# Patient Record
Sex: Female | Born: 1990 | Race: Black or African American | Hispanic: No | Marital: Single
Health system: Southern US, Community
[De-identification: ages and names within clinical notes are randomized; demographics above are authoritative.]

## PROBLEM LIST (undated history)

## (undated) ENCOUNTER — Inpatient Hospital Stay (HOSPITAL_COMMUNITY): Payer: Self-pay

## (undated) DIAGNOSIS — E669 Obesity, unspecified: Secondary | ICD-10-CM

## (undated) DIAGNOSIS — I10 Essential (primary) hypertension: Secondary | ICD-10-CM

## (undated) DIAGNOSIS — E079 Disorder of thyroid, unspecified: Secondary | ICD-10-CM

## (undated) DIAGNOSIS — J45909 Unspecified asthma, uncomplicated: Secondary | ICD-10-CM

## (undated) DIAGNOSIS — E039 Hypothyroidism, unspecified: Secondary | ICD-10-CM

## (undated) HISTORY — PX: WISDOM TOOTH EXTRACTION: SHX21

---
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
## 2011-09-25 ENCOUNTER — Inpatient Hospital Stay (HOSPITAL_COMMUNITY): Payer: Medicaid Other

## 2011-09-25 ENCOUNTER — Emergency Department (HOSPITAL_COMMUNITY): Payer: Medicaid Other

## 2011-09-25 ENCOUNTER — Encounter (HOSPITAL_COMMUNITY): Payer: Self-pay | Admitting: *Deleted

## 2011-09-25 ENCOUNTER — Inpatient Hospital Stay (HOSPITAL_COMMUNITY)
Admission: EM | Admit: 2011-09-25 | Discharge: 2011-09-26 | Disposition: A | Discharge status: Hospice | Payer: Medicaid Other | Attending: Obstetrics and Gynecology | Admitting: Obstetrics and Gynecology

## 2011-09-25 DIAGNOSIS — E039 Hypothyroidism, unspecified: Secondary | ICD-10-CM | POA: Insufficient documentation

## 2011-09-25 DIAGNOSIS — O99891 Other specified diseases and conditions complicating pregnancy: Secondary | ICD-10-CM | POA: Insufficient documentation

## 2011-09-25 DIAGNOSIS — J111 Influenza due to unidentified influenza virus with other respiratory manifestations: Secondary | ICD-10-CM | POA: Insufficient documentation

## 2011-09-25 DIAGNOSIS — Z331 Pregnant state, incidental: Secondary | ICD-10-CM

## 2011-09-25 DIAGNOSIS — E119 Type 2 diabetes mellitus without complications: Secondary | ICD-10-CM | POA: Insufficient documentation

## 2011-09-25 DIAGNOSIS — O10019 Pre-existing essential hypertension complicating pregnancy, unspecified trimester: Secondary | ICD-10-CM | POA: Insufficient documentation

## 2011-09-25 DIAGNOSIS — E079 Disorder of thyroid, unspecified: Secondary | ICD-10-CM | POA: Insufficient documentation

## 2011-09-25 DIAGNOSIS — O0930 Supervision of pregnancy with insufficient antenatal care, unspecified trimester: Secondary | ICD-10-CM | POA: Insufficient documentation

## 2011-09-25 DIAGNOSIS — O98519 Other viral diseases complicating pregnancy, unspecified trimester: Secondary | ICD-10-CM

## 2011-09-25 DIAGNOSIS — R109 Unspecified abdominal pain: Secondary | ICD-10-CM

## 2011-09-25 DIAGNOSIS — O24919 Unspecified diabetes mellitus in pregnancy, unspecified trimester: Secondary | ICD-10-CM | POA: Insufficient documentation

## 2011-09-25 DIAGNOSIS — O99280 Endocrine, nutritional and metabolic diseases complicating pregnancy, unspecified trimester: Secondary | ICD-10-CM | POA: Insufficient documentation

## 2011-09-25 HISTORY — DX: Essential (primary) hypertension: I10

## 2011-09-25 HISTORY — DX: Disorder of thyroid, unspecified: E07.9

## 2011-09-25 HISTORY — DX: Hypothyroidism, unspecified: E03.9

## 2011-09-25 LAB — BASIC METABOLIC PANEL
BUN: 5 mg/dL — ABNORMAL LOW (ref 6–23)
Calcium: 9.5 mg/dL (ref 8.4–10.5)
Potassium: 3.2 mEq/L — ABNORMAL LOW (ref 3.5–5.1)
Sodium: 132 mEq/L — ABNORMAL LOW (ref 135–145)

## 2011-09-25 LAB — CBC
HCT: 35.1 % — ABNORMAL LOW (ref 36.0–46.0)
Hemoglobin: 11.8 g/dL — ABNORMAL LOW (ref 12.0–15.0)
MCH: 23.6 pg — ABNORMAL LOW (ref 26.0–34.0)
MCHC: 33.2 g/dL (ref 30.0–36.0)
MCV: 70.1 fL — ABNORMAL LOW (ref 78.0–100.0)
MCV: 70.7 fL — ABNORMAL LOW (ref 78.0–100.0)
Platelets: 103 10*3/uL — ABNORMAL LOW (ref 150–400)
Platelets: 106 10*3/uL — ABNORMAL LOW (ref 150–400)
RDW: 16.2 % — ABNORMAL HIGH (ref 11.5–15.5)

## 2011-09-25 LAB — COMPREHENSIVE METABOLIC PANEL
ALT: 26 U/L (ref 0–35)
AST: 36 U/L (ref 0–37)
Albumin: 3.4 g/dL — ABNORMAL LOW (ref 3.5–5.2)
BUN: 5 mg/dL — ABNORMAL LOW (ref 6–23)
Creatinine, Ser: 0.57 mg/dL (ref 0.50–1.10)
GFR calc non Af Amer: >90 mL/min (ref >90)
Potassium: 3.4 mEq/L — ABNORMAL LOW (ref 3.5–5.1)
Sodium: 135 mEq/L (ref 135–145)
Total Protein: 7.6 g/dL (ref 6.0–8.3)

## 2011-09-25 LAB — URINE MICROSCOPIC-ADD ON

## 2011-09-25 LAB — URINALYSIS, ROUTINE W REFLEX MICROSCOPIC
Ketones, ur: >80 mg/dL — AB
Leukocytes, UA: NEGATIVE
Nitrite: NEGATIVE
Protein, ur: 100 mg/dL — AB
Specific Gravity, Urine: 1.024 (ref 1.005–1.030)
pH: 6 (ref 5.0–8.0)

## 2011-09-25 LAB — DIFFERENTIAL
Basophils Absolute: 0 10*3/uL (ref 0.0–0.1)
Eosinophils Absolute: 0.1 10*3/uL (ref 0.0–0.7)
Eosinophils Relative: 1 % (ref 0–5)
Neutro Abs: 4.3 10*3/uL (ref 1.7–7.7)

## 2011-09-25 LAB — HCG, QUANTITATIVE, PREGNANCY: hCG, Beta Chain, Quant, S: 20287 m[IU]/mL — ABNORMAL HIGH (ref <5)

## 2011-09-25 MED ORDER — SODIUM CHLORIDE 0.9 % IV BOLUS (SEPSIS): 1000.0000 mL | Freq: Once | INTRAVENOUS | Status: DC

## 2011-09-25 MED ORDER — SODIUM CHLORIDE 0.9 % IV BOLUS (SEPSIS)
1000.0000 mL | Freq: Once | INTRAVENOUS | Status: AC
  Administered 2011-09-25: 1000 mL via INTRAVENOUS

## 2011-09-25 NOTE — ED Provider Notes (Signed)
Patient reevaluated from triage. Present with 3 days of body aches, runny nose, subjective fever and chills with abdominal cramping and nausea. On exam she is obese but has a palpable uterus midway between the xiphoid and umbilicus. She denies any vaginal bleeding or discharge. She did not know she was pregnant. She does not know when her last period was in she is on Depo-Provera. Bimanual exam performed by the nurse shows a high and soft cervix. OB rapid response nurse consult and patient placed on monitor.  BP 138/81  Temp(Src) 98.2 F (36.8 C) (Oral)  Resp 22  SpO2 98%  Patient accepted in transfer to [facility] by Dr. Claiborne Billings.  Glynn Octave, MD 09/25/11 2119

## 2011-09-25 NOTE — MAU Provider Note (Signed)
History     CSN: [id number]  Arrival date and time: 09/25/11 1642   None     Chief Complaint  Patient presents with  . Influenza   HPI 21 y.o. G1P0 at Unknown EGA. Sent from [facility] ED for eval d/t no prenatal care. Pt went to ED tonight for sore throat/sinus congestion, + UPT there. Pt unaware of pregnancy. LMP was sometime in November. Had Depo Provera for the first time 6 months ago, missed second injection. Currently being followed by PCP for HTN, hypthyroid and Type II diabetes. She has been on lisinopril throughout this pregnancy. She also takes metformin and synthroid.    Past Medical History  Diagnosis Date  . Thyroid disease   . Hypertension   . Diabetes mellitus   . Hypothyroidism     Past Surgical History  Procedure Date  . Wisdom tooth extraction     Family History  Problem Relation Age of Onset  . Cancer Paternal Aunt   . Cancer Paternal Uncle   . Diabetes Paternal Grandmother   . Diabetes Paternal Grandfather     History  Substance Use Topics  . Smoking status: Never Smoker   . Smokeless tobacco: Not on file  . Alcohol Use: No    Allergies: No Known Allergies   (Not in a hospital admission)  Review of Systems  Constitutional: Negative.   HENT: Positive for congestion and sore throat.   Respiratory: Negative.   Cardiovascular: Negative.   Gastrointestinal: Negative for nausea, vomiting, abdominal pain, diarrhea and constipation.  Genitourinary: Negative for dysuria, urgency, frequency, hematuria and flank pain.       Negative for vaginal bleeding, vaginal discharge, cramping/contractions  Musculoskeletal: Negative.   Neurological: Negative.   Psychiatric/Behavioral: Negative.    Physical Exam   Blood pressure 135/61, pulse 97, temperature 98.2 F (36.8 C), temperature source Oral, resp. rate 22, SpO2 100.00%.  Physical Exam  Nursing note and vitals reviewed. Constitutional: She is oriented to person, place, and time. She appears  well-developed and well-nourished. No distress.       Obese   Cardiovascular: Normal rate.   Respiratory: Effort normal. No respiratory distress.  GI: Soft.  Musculoskeletal: Normal range of motion.  Neurological: She is alert and oriented to person, place, and time.  Skin: Skin is warm and dry.  Psychiatric: She has a normal mood and affect.    MAU Course  Procedures  Results for orders placed during the hospital encounter of 09/25/11 (from the past 24 hour(s))  CBC     Status: Abnormal   Collection Time   09/25/11  5:37 PM      Component Value Range   WBC 6.8  4.0 - 10.5 (K/uL)   RBC 5.01  3.87 - 5.11 (MIL/uL)   Hemoglobin 11.8 (*) 12.0 - 15.0 (g/dL)   HCT 96.2 (*) 95.2 - 46.0 (%)   MCV 70.1 (*) 78.0 - 100.0 (fL)   MCH 23.6 (*) 26.0 - 34.0 (pg)   MCHC 33.6  30.0 - 36.0 (g/dL)   RDW 84.1 (*) 32.4 - 15.5 (%)   Platelets 103 (*) 150 - 400 (K/uL)  BASIC METABOLIC PANEL     Status: Abnormal   Collection Time   09/25/11  5:37 PM      Component Value Range   Sodium 132 (*) 135 - 145 (mEq/L)   Potassium 3.2 (*) 3.5 - 5.1 (mEq/L)   Chloride 100  96 - 112 (mEq/L)   CO2 17 (*) 19 - 32 (mEq/L)  Glucose, Bld 124 (*) 70 - 99 (mg/dL)   BUN 5 (*) 6 - 23 (mg/dL)   Creatinine, Ser 1.61 (*) 0.50 - 1.10 (mg/dL)   Calcium 9.5  8.4 - 09.6 (mg/dL)   GFR calc non Af Amer >90  >90 (mL/min)   GFR calc Af Amer >90  >90 (mL/min)  URINALYSIS, ROUTINE W REFLEX MICROSCOPIC     Status: Abnormal   Collection Time   09/25/11  5:42 PM      Component Value Range   Color, Urine YELLOW  YELLOW    APPearance CLOUDY (*) CLEAR    Specific Gravity, Urine 1.024  1.005 - 1.030    pH 6.0  5.0 - 8.0    Glucose, UA 100 (*) NEGATIVE (mg/dL)   Hgb urine dipstick TRACE (*) NEGATIVE    Bilirubin Urine SMALL (*) NEGATIVE    Ketones, ur >80 (*) NEGATIVE (mg/dL)   Protein, ur 045 (*) NEGATIVE (mg/dL)   Urobilinogen, UA 1.0  0.0 - 1.0 (mg/dL)   Nitrite NEGATIVE  NEGATIVE    Leukocytes, UA NEGATIVE  NEGATIVE   POCT  PREGNANCY, URINE     Status: Abnormal   Collection Time   09/25/11  5:42 PM      Component Value Range   Preg Test, Ur POSITIVE (*) NEGATIVE   URINE MICROSCOPIC-ADD ON     Status: Abnormal   Collection Time   09/25/11  5:42 PM      Component Value Range   Squamous Epithelial / LPF FEW (*) RARE    WBC, UA 0-2  <3 (WBC/hpf)   RBC / HPF 0-2  <3 (RBC/hpf)   Bacteria, UA RARE  RARE    Casts GRANULAR CAST (*) NEGATIVE    Crystals CA OXALATE CRYSTALS (*) NEGATIVE    Urine-Other AMORPHOUS URATES/PHOSPHATES    CBC     Status: Abnormal   Collection Time   09/25/11  7:03 PM      Component Value Range   WBC 7.6  4.0 - 10.5 (K/uL)   RBC 5.02  3.87 - 5.11 (MIL/uL)   Hemoglobin 11.8 (*) 12.0 - 15.0 (g/dL)   HCT 40.9 (*) 81.1 - 46.0 (%)   MCV 70.7 (*) 78.0 - 100.0 (fL)   MCH 23.5 (*) 26.0 - 34.0 (pg)   MCHC 33.2  30.0 - 36.0 (g/dL)   RDW 91.4 (*) 78.2 - 15.5 (%)   Platelets 106 (*) 150 - 400 (K/uL)  DIFFERENTIAL     Status: Normal   Collection Time   09/25/11  7:03 PM      Component Value Range   Neutrophils Relative 57  43 - 77 (%)   Neutro Abs 4.3  1.7 - 7.7 (K/uL)   Lymphocytes Relative 36  12 - 46 (%)   Lymphs Abs 2.8  0.7 - 4.0 (K/uL)   Monocytes Relative 6  3 - 12 (%)   Monocytes Absolute 0.5  0.1 - 1.0 (K/uL)   Eosinophils Relative 1  0 - 5 (%)   Eosinophils Absolute 0.1  0.0 - 0.7 (K/uL)   Basophils Relative 0  0 - 1 (%)   Basophils Absolute 0.0  0.0 - 0.1 (K/uL)  COMPREHENSIVE METABOLIC PANEL     Status: Abnormal   Collection Time   09/25/11  7:03 PM      Component Value Range   Sodium 135  135 - 145 (mEq/L)   Potassium 3.4 (*) 3.5 - 5.1 (mEq/L)   Chloride 101  96 - 112 (  mEq/L)   CO2 21  19 - 32 (mEq/L)   Glucose, Bld 117 (*) 70 - 99 (mg/dL)   BUN 5 (*) 6 - 23 (mg/dL)   Creatinine, Ser 1.61  0.50 - 1.10 (mg/dL)   Calcium 9.7  8.4 - 09.6 (mg/dL)   Total Protein 7.6  6.0 - 8.3 (g/dL)   Albumin 3.4 (*) 3.5 - 5.2 (g/dL)   AST 36  0 - 37 (U/L)   ALT 26  0 - 35 (U/L)   Alkaline  Phosphatase 115  39 - 117 (U/L)   Total Bilirubin 0.2 (*) 0.3 - 1.2 (mg/dL)   GFR calc non Af Amer >90  >90 (mL/min)   GFR calc Af Amer >90  >90 (mL/min)  HCG, QUANTITATIVE, PREGNANCY     Status: Abnormal   Collection Time   09/25/11  7:03 PM      Component Value Range   hCG, Beta Chain, Quant, S 20287 (*) <5 (mIU/mL)     Assessment and Plan  U/S report pending Care assumed by S. [place], CNM  [facility] 09/25/2011, 10:19 PM

## 2011-09-25 NOTE — ED Notes (Signed)
OB RR RN Victorino Dike here and with patient.

## 2011-09-25 NOTE — Discharge Instructions (Signed)
ABCs of Pregnancy A Antepartum care is very important. Be sure you see your doctor and get prenatal care as soon as you think you are pregnant. At this time, you will be tested for infection, genetic abnormalities and potential problems with you and the pregnancy. This is the time to discuss diet, exercise, work, medications, labor, pain medication during labor and the possibility of a cesarean delivery. Ask any questions that may concern you. It is important to see your doctor regularly throughout your pregnancy. Avoid exposure to toxic substances and chemicals - such as cleaning solvents, lead and mercury, some insecticides, and paint. Pregnant women should avoid exposure to paint fumes, and fumes that cause you to feel ill, dizzy or faint. When possible, it is a good idea to have a pre-pregnancy consultation with your caregiver to begin some important recommendations your caregiver suggests such as, taking folic acid, exercising, quitting smoking, avoiding alcoholic beverages, etc. B Breastfeeding is the healthiest choice for both you and your baby. It has many nutritional benefits for the baby and health benefits for the mother. It also creates a very tight and loving bond between the baby and mother. Talk to your doctor, your family and friends, and your employer about how you choose to feed your baby and how they can support you in your decision. Not all birth defects can be prevented, but a woman can take actions that may increase her chance of having a healthy baby. Many birth defects happen very early in pregnancy, sometimes before a woman even knows she is pregnant. Birth defects or abnormalities of any child in your or the father's family should be discussed with your caregiver. Get a good support bra as your breast size changes. Wear it especially when you exercise and when nursing.  C Celebrate the news of your pregnancy with the your spouse/father and family. Childbirth classes are helpful to  take for you and the spouse/father because it helps to understand what happens during the pregnancy, labor and delivery. Cesarean delivery should be discussed with your doctor so you are prepared for that possibility. The pros and cons of circumcision if it is a boy, should be discussed with your pediatrician. Cigarette smoking during pregnancy can result in low birth weight babies. It has been associated with infertility, miscarriages, tubal pregnancies, infant death (mortality) and poor health (morbidity) in childhood. Additionally, cigarette smoking may cause long-term learning disabilities. If you smoke, you should try to quit before getting pregnant and not smoke during the pregnancy. Secondary smoke may also harm a mother and her developing baby. It is a good idea to ask people to stop smoking around you during your pregnancy and after the baby is born. Extra calcium is necessary when you are pregnant and is found in your prenatal vitamin, in dairy products, green leafy vegetables and in calcium supplements. D A healthy diet according to your current weight and height, along with vitamins and mineral supplements should be discussed with your caregiver. Domestic abuse or violence should be made known to your doctor right away to get the situation corrected. Drink more water when you exercise to keep hydrated. Discomfort of your back and legs usually develops and progresses from the middle of the second trimester through to delivery of the baby. This is because of the enlarging baby and uterus, which may also affect your balance. Do not take illegal drugs. Illegal drugs can seriously harm the baby and you. Drink extra fluids (water is best) throughout pregnancy to help  your body keep up with the increases in your blood volume. Drink at least 6 to 8 glasses of water, fruit juice, or milk each day. A good way to know you are drinking enough fluid is when your urine looks almost like clear water or is very light  yellow.  E Eat healthy to get the nutrients you and your unborn baby need. Your meals should include the five basic food groups. Exercise (30 minutes of light to moderate exercise a day) is important and encouraged during pregnancy, if there are no medical problems or problems with the pregnancy. Exercise that causes discomfort or dizziness should be stopped and reported to your caregiver. Emotions during pregnancy can change from being ecstatic to depression and should be understood by you, your partner and your family. F Fetal screening with ultrasound, amniocentesis and monitoring during pregnancy and labor is common and sometimes necessary. Take 400 micrograms of folic acid daily both before, when possible, and during the first few months of pregnancy to reduce the risk of birth defects of the brain and spine. All women who could possibly become pregnant should take a vitamin with folic acid, every day. It is also important to eat a healthy diet with fortified foods (enriched grain products, including cereals, rice, breads, and pastas) and foods with natural sources of folate (orange juice, green leafy vegetables, beans, peanuts, broccoli, asparagus, peas, and lentils). The father should be involved with all aspects of the pregnancy including, the prenatal care, childbirth classes, labor, delivery, and postpartum time. Fathers may also have emotional concerns about being a father, financial needs, and raising a family. G Genetic testing should be done appropriately. It is important to know your family and the father's history. If there have been problems with pregnancies or birth defects in your family, report these to your doctor. Also, genetic counselors can talk with you about the information you might need in making decisions about having a family. You can call a major medical center in your area for help in finding a board-certified genetic counselor. Genetic testing and counseling should be done  before pregnancy when possible, especially if there is a history of problems in the mother's or father's family. Certain ethnic backgrounds are more at risk for genetic defects. H Get familiar with the hospital where you will be having your baby. Get to know how long it takes to get there, the labor and delivery area, and the hospital procedures. Be sure your medical insurance is accepted there. Get your home ready for the baby including, clothes, the baby's room (when possible), furniture and car seat. Hand washing is important throughout the day, especially after handling raw meat and poultry, changing the baby's diaper or using the bathroom. This can help prevent the spread of many bacteria and viruses that cause infection. Your hair may become dry and thinner, but will return to normal a few weeks after the baby is born. Heartburn is a common problem that can be treated by taking antacids recommended by your caregiver, eating smaller meals 5 or 6 times a day, not drinking liquids when eating, drinking between meals and raising the head of your bed 2 to 3 inches. I Insurance to cover you, the baby, doctor and hospital should be reviewed so that you will be prepared to pay any costs not covered by your insurance plan. If you do not have medical insurance, there are usually clinics and services available for you in your community. Take 30 milligrams of iron during  your pregnancy as prescribed by your doctor to reduce the risk of low red blood cells (anemia) later in pregnancy. All women of childbearing age should eat a diet rich in iron. J There should be a joint effort for the mother, father and any other children to adapt to the pregnancy financially, emotionally, and psychologically during the pregnancy. Join a support group for moms-to-be. Or, join a class on parenting or childbirth. Have the family participate when possible. K Know your limits. Let your caregiver know if you experience any of the  following:   Pain of any kind.   Strong cramps.   You develop a lot of weight in a short period of time (5 pounds in 3 to 5 days).   Vaginal bleeding, leaking of amniotic fluid.   Headache, vision problems.   Dizziness, fainting, shortness of breath.   Chest pain.   Fever of 102 F (38.9 C) or higher.   Gush of clear fluid from your vagina.   Painful urination.   Domestic violence.   Irregular heartbeat (palpitations).   Rapid beating of the heart (tachycardia).   Constant feeling sick to your stomach (nauseous) and vomiting.   Trouble walking, fluid retention (edema).   Muscle weakness.   If your baby has decreased activity.   Persistent diarrhea.   Abnormal vaginal discharge.   Uterine contractions at 20-minute intervals.   Back pain that travels down your leg.  L Learn and practice that what you eat and drink should be in moderation and healthy for you and your baby. Legal drugs such as alcohol and caffeine are important issues for pregnant women. There is no safe amount of alcohol a woman can drink while pregnant. Fetal alcohol syndrome, a disorder characterized by growth retardation, facial abnormalities, and central nervous system dysfunction, is caused by a woman's use of alcohol during pregnancy. Caffeine, found in tea, coffee, soft drinks and chocolate, should also be limited. Be sure to read labels when trying to cut down on caffeine during pregnancy. More than 200 foods, beverages, and over-the-counter medications contain caffeine and have a high salt content! There are coffees and teas that do not contain caffeine. M Medical conditions such as diabetes, epilepsy, and high blood pressure should be treated and kept under control before pregnancy when possible, but especially during pregnancy. Ask your caregiver about any medications that may need to be changed or adjusted during pregnancy. If you are currently taking any medications, ask your caregiver if it  is safe to take them while you are pregnant or before getting pregnant when possible. Also, be sure to discuss any herbs or vitamins you are taking. They are medicines, too! Discuss with your doctor all medications, prescribed and over-the-counter, that you are taking. During your prenatal visit, discuss the medications your doctor may give you during labor and delivery. N Never be afraid to ask your doctor or caregiver questions about your health, the progress of the pregnancy, family problems, stressful situations, and recommendation for a pediatrician, if you do not have one. It is better to take all precautions and discuss any questions or concerns you may have during your office visits. It is a good idea to write down your questions before you visit the doctor. O Over-the-counter cough and cold remedies may contain alcohol or other ingredients that should be avoided during pregnancy. Ask your caregiver about prescription, herbs or over-the-counter medications that you are taking or may consider taking while pregnant.  P Physical activity during pregnancy can  benefit both you and your baby by lessening discomfort and fatigue, providing a sense of well-being, and increasing the likelihood of early recovery after delivery. Light to moderate exercise during pregnancy strengthens the belly (abdominal) and back muscles. This helps improve posture. Practicing yoga, walking, swimming, and cycling on a stationary bicycle are usually safe exercises for pregnant women. Avoid scuba diving, exercise at high altitudes (over 3000 feet), skiing, horseback riding, contact sports, etc. Always check with your doctor before beginning any kind of exercise, especially during pregnancy and especially if you did not exercise before getting pregnant. Q Queasiness, stomach upset and morning sickness are common during pregnancy. Eating a couple of crackers or dry toast before getting out of bed. Foods that you normally love may  make you feel sick to your stomach. You may need to substitute other nutritious foods. Eating 5 or 6 small meals a day instead of 3 large ones may make you feel better. Do not drink with your meals, drink between meals. Questions that you have should be written down and asked during your prenatal visits. R Read about and make plans to baby-proof your home. There are important tips for making your home a safer environment for your baby. Review the tips and make your home safer for you and your baby. Read food labels regarding calories, salt and fat content in the food. S Saunas, hot tubs, and steam rooms should be avoided while you are pregnant. Excessive high heat may be harmful during your pregnancy. Your caregiver will screen and examine you for sexually transmitted diseases and genetic disorders during your prenatal visits. Learn the signs of labor. Sexual relations while pregnant is safe unless there is a medical or pregnancy problem and your caregiver advises against it. T Traveling long distances should be avoided especially in the third trimester of your pregnancy. If you do have to travel out of state, be sure to take a copy of your medical records and medical insurance plan with you. You should not travel long distances without seeing your doctor first. Most airlines will not allow you to travel after 36 weeks of pregnancy. Toxoplasmosis is an infection caused by a parasite that can seriously harm an unborn baby. Avoid eating undercooked meat and handling cat litter. Be sure to wear gloves when gardening. Tingling of the hands and fingers is not unusual and is due to fluid retention. This will go away after the baby is born. U Womb (uterus) size increases during the first trimester. Your kidneys will begin to function more efficiently. This may cause you to feel the need to urinate more often. You may also leak urine when sneezing, coughing or laughing. This is due to the growing uterus pressing  against your bladder, which lies directly in front of and slightly under the uterus during the first few months of pregnancy. If you experience burning along with frequency of urination or bloody urine, be sure to tell your doctor. The size of your uterus in the third trimester may cause a problem with your balance. It is advisable to maintain good posture and avoid wearing high heels during this time. An ultrasound of your baby may be necessary during your pregnancy and is safe for you and your baby. V Vaccinations are an important concern for pregnant women. Get needed vaccines before pregnancy. Center for Disease Control (http://www.wolf.info/) has clear guidelines for the use of vaccines during pregnancy. Review the list, be sure to discuss it with your doctor. Prenatal vitamins are helpful  and healthy for you and the baby. Do not take extra vitamins except what is recommended. Taking too much of certain vitamins can cause overdose problems. Continuous vomiting should be reported to your caregiver. Varicose veins may appear especially if there is a family history of varicose veins. They should subside after the delivery of the baby. Support hose helps if there is leg discomfort. W Being overweight or underweight during pregnancy may cause problems. Try to get within 15 pounds of your ideal weight before pregnancy. Remember, pregnancy is not a time to be dieting! Do not stop eating or start skipping meals as your weight increases. Both you and your baby need the calories and nutrition you receive from a healthy diet. Be sure to consult with your doctor about your diet. There is a formula and diet plan available depending on whether you are overweight or underweight. Your caregiver or nutritionist can help and advise you if necessary. X Avoid X-rays. If you must have dental work or diagnostic tests, tell your dentist or physician that you are pregnant so that extra care can be taken. X-rays should only be taken when  the risks of not taking them outweigh the risk of taking them. If needed, only the minimum amount of radiation should be used. When X-rays are necessary, protective lead shields should be used to cover areas of the body that are not being X-rayed. Y Your baby loves you. Breastfeeding your baby creates a loving and very close bond between the two of you. Give your baby a healthy environment to live in while you are pregnant. Infants and children require constant care and guidance. Their health and safety should be carefully watched at all times. After the baby is born, rest or take a nap when the baby is sleeping. Z Get your ZZZs. Be sure to get plenty of rest. Resting on your side as often as possible, especially on your left side is advised. It provides the best circulation to your baby and helps reduce swelling. Try taking a nap for 30 to 45 minutes in the afternoon when possible. After the baby is born rest or take a nap when the baby is sleeping. Try elevating your feet for that amount of time when possible. It helps the circulation in your legs and helps reduce swelling.  Most information courtesy of the CDC. Document Released: 05/12/2005 Document Revised: 05/01/2011 Document Reviewed: 01/24/2009 [facility] Patient Information 2012 [place], [place].Abdominal Pain During Pregnancy Belly (abdominal) pain is common during pregnancy. Most of the time, it is not a serious problem. Other times, it can be a sign that something is wrong with the pregnancy. Always tell your doctor if you have belly pain. HOME CARE For mild pain:  Do not have sex (intercourse) or put anything in your vagina until you feel better.   Rest until your pain stops. If your pain lasts longer than 1 hour, call your doctor.   Drink clear fluids if you feel sick to your stomach (nauseous).   Do not eat solid food until you feel better.   Only take medicine as told by your doctor.   Keep all doctor visits as told.  GET HELP  RIGHT AWAY IF:   You are bleeding, leaking fluid, or pieces of tissue come out of your vagina.   You have more pain or cramping.   You keep throwing up (vomiting).   You have pain when you pee (urinate) or have blood in your pee.   You have a  fever.   You do not feel your baby moving as much.   You feel very weak or feel like passing out.   You have trouble breathing, with or without belly pain.   You have a very bad headache and belly pain.   You have fluid leaking from your vagina and belly pain.   You keep having watery poop (diarrhea).   Your belly pain does not go away after resting, or the pain gets worse.  MAKE SURE YOU:   Understand these instructions.   Will watch your condition.   Will get help right away if you are not doing well or get worse.  Document Released: 04/30/2009 Document Revised: 05/01/2011 Document Reviewed: 12/06/2010 [facility] Patient Information 2012 [place], [place].Upper Respiratory Infection, Adult An upper respiratory infection (URI) is also sometimes known as the common cold. The upper respiratory tract includes the nose, sinuses, throat, trachea, and bronchi. Bronchi are the airways leading to the lungs. Most people improve within 1 week, but symptoms can last up to 2 weeks. A residual cough may last even longer.  CAUSES Many different viruses can infect the tissues lining the upper respiratory tract. The tissues become irritated and inflamed and often become very moist. Mucus production is also common. A cold is contagious. You can easily spread the virus to others by oral contact. This includes kissing, sharing a glass, coughing, or sneezing. Touching your mouth or nose and then touching a surface, which is then touched by another person, can also spread the virus. SYMPTOMS  Symptoms typically develop 1 to 3 days after you come in contact with a cold virus. Symptoms vary from person to person. They may include:  Runny nose.   Sneezing.    Nasal congestion.   Sinus irritation.   Sore throat.   Loss of voice (laryngitis).   Cough.   Fatigue.   Muscle aches.   Loss of appetite.   Headache.   Low-grade fever.  DIAGNOSIS  You might diagnose your own cold based on familiar symptoms, since most people get a cold 2 to 3 times a year. Your caregiver can confirm this based on your exam. Most importantly, your caregiver can check that your symptoms are not due to another disease such as strep throat, sinusitis, pneumonia, asthma, or epiglottitis. Blood tests, throat tests, and X-rays are not necessary to diagnose a common cold, but they may sometimes be helpful in excluding other more serious diseases. Your caregiver will decide if any further tests are required. RISKS AND COMPLICATIONS  You may be at risk for a more severe case of the common cold if you smoke cigarettes, have chronic heart disease (such as heart failure) or lung disease (such as asthma), or if you have a weakened immune system. The very young and very old are also at risk for more serious infections. Bacterial sinusitis, middle ear infections, and bacterial pneumonia can complicate the common cold. The common cold can worsen asthma and chronic obstructive pulmonary disease (COPD). Sometimes, these complications can require emergency medical care and may be life-threatening. PREVENTION  The best way to protect against getting a cold is to practice good hygiene. Avoid oral or hand contact with people with cold symptoms. Wash your hands often if contact occurs. There is no clear evidence that vitamin C, vitamin E, echinacea, or exercise reduces the chance of developing a cold. However, it is always recommended to get plenty of rest and practice good nutrition. TREATMENT  Treatment is directed at relieving symptoms. There  is no cure. Antibiotics are not effective, because the infection is caused by a virus, not by bacteria. Treatment may include:  Increased fluid  intake. Sports drinks offer valuable electrolytes, sugars, and fluids.   Breathing heated mist or steam (vaporizer or shower).   Eating chicken soup or other clear broths, and maintaining good nutrition.   Getting plenty of rest.   Using gargles or lozenges for comfort.   Controlling fevers with ibuprofen or acetaminophen as directed by your caregiver.   Increasing usage of your inhaler if you have asthma.  Zinc gel and zinc lozenges, taken in the first 24 hours of the common cold, can shorten the duration and lessen the severity of symptoms. Pain medicines may help with fever, muscle aches, and throat pain. A variety of non-prescription medicines are available to treat congestion and runny nose. Your caregiver can make recommendations and may suggest nasal or lung inhalers for other symptoms.  HOME CARE INSTRUCTIONS   Only take over-the-counter or prescription medicines for pain, discomfort, or fever as directed by your caregiver.   Use a warm mist humidifier or inhale steam from a shower to increase air moisture. This may keep secretions moist and make it easier to breathe.   Drink enough water and fluids to keep your urine clear or pale yellow.   Rest as needed.   Return to work when your temperature has returned to normal or as your caregiver advises. You may need to stay home longer to avoid infecting others. You can also use a face mask and careful hand washing to prevent spread of the virus.  SEEK MEDICAL CARE IF:   After the first few days, you feel you are getting worse rather than better.   You need your caregiver's advice about medicines to control symptoms.   You develop chills, worsening shortness of breath, or brown or red sputum. These may be signs of pneumonia.   You develop yellow or brown nasal discharge or pain in the face, especially when you bend forward. These may be signs of sinusitis.   You develop a fever, swollen neck glands, pain with swallowing, or white  areas in the back of your throat. These may be signs of strep throat.  SEEK IMMEDIATE MEDICAL CARE IF:   You have a fever.   You develop severe or persistent headache, ear pain, sinus pain, or chest pain.   You develop wheezing, a prolonged cough, cough up blood, or have a change in your usual mucus (if you have chronic lung disease).   You develop sore muscles or a stiff neck.  Document Released: 11/05/2000 Document Revised: 05/01/2011 Document Reviewed: 09/13/2010 [facility] Patient Information 2012 [place], [place].

## 2011-09-25 NOTE — Progress Notes (Signed)
Called to assess pt with no PNC, pt alert in NAD. Unsure of LMP, est 30weeks by fundal height. Call to unassigned Dr Claiborne Billings, accepting pt for transport to [facility] for work up. EDP ordered transfer and carelink notified, MAU charge RN Debra notified, care link here, pt verbalizes uinderstanding. To [facility]U

## 2011-09-25 NOTE — ED Notes (Signed)
Pt is here for URI and body aches and fever and chills since Monday.  Pt states that she has been having nausea and vomiting with this. Pt also has abdominal pain with this

## 2011-09-25 NOTE — ED Notes (Signed)
OB RR RN notified to come to ED.  Patient being placed on fetal monitor.

## 2011-09-25 NOTE — MAU Note (Signed)
Pt was  Sent here from [facility] hosp for further eval.  C/o sore throat, dry cough, running nose and headache X 3 days.

## 2011-09-25 NOTE — ED Provider Notes (Addendum)
History  Scribed for Hilario Quarry, MD, the patient was seen in room STRE8/STRE8. This chart was scribed by Candelaria Stagers. The patient's care started at 6:06 PM    CSN: [id number]  Arrival date & time 09/25/11  1642   None     Chief Complaint  Patient presents with  . Influenza    HPI Sandra House is a 21 y.o. female who presents to the Emergency Department complaining of body aches that started with a cough on Monday getting worse today.  She is experiencing sore throat, vomiting, and cough.  She denies fever.  She has been able to keep fluids done today. She has a h/o diabetes.    Past Medical History  Diagnosis Date  . Thyroid disease   . Hypertension   . Diabetes mellitus     History reviewed. No pertinent past surgical history.  No family history on file.  History  Substance Use Topics  . Smoking status: Not on file  . Smokeless tobacco: Not on file  . Alcohol Use: Yes    OB History    Grav Para Term Preterm Abortions TAB SAB Ect Mult Living                  Review of Systems  HENT: Positive for sore throat and rhinorrhea.   Respiratory: Positive for cough.   Gastrointestinal: Positive for nausea and vomiting.  All other systems reviewed and are negative.    Allergies  Review of patient's allergies indicates no known allergies.  Home Medications   Current Outpatient Rx  Name Route Sig Dispense Refill  . LEVOTHYROXINE SODIUM 50 MCG PO TABS Oral Take 50 mcg by mouth daily.    Marland Kitchen LISINOPRIL 20 MG PO TABS Oral Take 20 mg by mouth daily.    Marland Kitchen METFORMIN HCL 500 MG PO TABS Oral Take 500 mg by mouth 2 (two) times daily with a meal.      BP 138/81  Temp(Src) 98.2 F (36.8 C) (Oral)  Resp 22  SpO2 98%  Physical Exam  Nursing note and vitals reviewed. Constitutional: She is oriented to person, place, and time. She appears well-developed and well-nourished.  HENT:  Head: Normocephalic and atraumatic.  Eyes: EOM are normal.  Neck: Normal range  of motion. Neck supple.  Pulmonary/Chest: Effort normal and breath sounds normal. She has no wheezes. She has no rales.  Abdominal: There is no tenderness.       Patient is morbidly obese and has large firm abdomen.   Musculoskeletal: Normal range of motion. She exhibits no edema.  Neurological: She is alert and oriented to person, place, and time.  Skin: Skin is warm and dry.  Psychiatric: She has a normal mood and affect. Her behavior is normal.    ED Course  Procedures  DIAGNOSTIC STUDIES: Oxygen Saturation is 98% on room air, normal by my interpretation.    COORDINATION OF CARE:  6:33PM Ordered: Sodium chloride 0.9% bolus 1,062mL: CBC: Differential: Comprehensive metabolic panel: hCG, quantitate, pregnancy, US Ob Comp+ 14 Wk;    Labs Reviewed  POCT PREGNANCY, URINE - Abnormal; Notable for the following:    Preg Test, Ur POSITIVE (*)    All other components within normal limits  CBC  BASIC METABOLIC PANEL  URINALYSIS, ROUTINE W REFLEX MICROSCOPIC   No results found.   No diagnosis found.  I personally performed the services described in this documentation, which was scribed in my presence. The recorded information has been reviewed and considered.  MDM  Patient care discussed with Dr. Consuella Lose. He is aware of the pending ultrasound. She'll receive an additional liter of fluid and have venous blood gas drawn. She is to be set up for pelvic exam.      Hilario Quarry, MD 09/25/11 1610  Hilario Quarry, MD 09/25/11 [id number]

## 2011-09-25 NOTE — ED Notes (Signed)
Patient to be transferred to MAU.

## 2011-09-30 ENCOUNTER — Encounter: Payer: Self-pay | Admitting: *Deleted

## 2011-09-30 ENCOUNTER — Telehealth: Payer: Self-pay | Admitting: *Deleted

## 2011-09-30 NOTE — Telephone Encounter (Signed)
Message copied by Darrel Hoover on Tue Sep 30, 2011  8:16 AM ------      Message from: Jaynie Collins A      Created: Fri Sep 26, 2011  4:30 PM      Regarding: Needs [facility] appt ASAP       Seen in [facility] by Nestor Ramp MD Renaldo Fiddler) today:  Did not know she was pregnant, scan showed her to be [redacted] weeks GA, has CHTN, hypothyroidism, DM, low platelets (106K).  Needs appointment soon as she needs to be in testing too.            Thank you!            UAA

## 2011-09-30 NOTE — Telephone Encounter (Signed)
Have schedule an appointment for first prenatal visit on Monday 10/06/11 at 8:00 am.  Mailed patient a letter with the appointment information.

## 2011-09-30 NOTE — Telephone Encounter (Signed)
Left voicemail message for patient yesterday 09/29/11 at approximately 11:15 am.  Tried to reach patient again this morning.  Left another message requesting she return my call in regards to an appointment.

## 2011-10-06 ENCOUNTER — Encounter: Payer: Self-pay | Admitting: Family Medicine

## 2011-10-06 ENCOUNTER — Ambulatory Visit (INDEPENDENT_AMBULATORY_CARE_PROVIDER_SITE_OTHER): Payer: Self-pay | Admitting: Family Medicine

## 2011-10-06 VITALS — BP 139/84 | Temp 96.0°F | Ht 62.0 in | Wt 360.3 lb

## 2011-10-06 DIAGNOSIS — E039 Hypothyroidism, unspecified: Secondary | ICD-10-CM | POA: Insufficient documentation

## 2011-10-06 DIAGNOSIS — O24919 Unspecified diabetes mellitus in pregnancy, unspecified trimester: Secondary | ICD-10-CM | POA: Insufficient documentation

## 2011-10-06 DIAGNOSIS — O0990 Supervision of high risk pregnancy, unspecified, unspecified trimester: Secondary | ICD-10-CM | POA: Insufficient documentation

## 2011-10-06 DIAGNOSIS — E119 Type 2 diabetes mellitus without complications: Secondary | ICD-10-CM | POA: Insufficient documentation

## 2011-10-06 DIAGNOSIS — I10 Essential (primary) hypertension: Secondary | ICD-10-CM

## 2011-10-06 DIAGNOSIS — O169 Unspecified maternal hypertension, unspecified trimester: Secondary | ICD-10-CM

## 2011-10-06 DIAGNOSIS — E079 Disorder of thyroid, unspecified: Secondary | ICD-10-CM

## 2011-10-06 DIAGNOSIS — O99280 Endocrine, nutritional and metabolic diseases complicating pregnancy, unspecified trimester: Secondary | ICD-10-CM | POA: Insufficient documentation

## 2011-10-06 LAB — HEMOGLOBIN A1C
Hgb A1c MFr Bld: 7.6 % — ABNORMAL HIGH (ref <5.7)
Mean Plasma Glucose: 171 mg/dL — ABNORMAL HIGH (ref <117)

## 2011-10-06 LAB — POCT URINALYSIS DIP (DEVICE)
Bilirubin Urine: NEGATIVE
Glucose, UA: 500 mg/dL — AB
Nitrite: NEGATIVE
Protein, ur: NEGATIVE mg/dL
Urobilinogen, UA: 0.2 mg/dL (ref 0.0–1.0)

## 2011-10-06 LAB — TSH: TSH: 1.427 u[IU]/mL (ref 0.350–4.500)

## 2011-10-06 LAB — HIV ANTIBODY (ROUTINE TESTING W REFLEX): HIV: NONREACTIVE

## 2011-10-06 NOTE — Patient Instructions (Signed)
Pregnancy - Third Trimester The third trimester of pregnancy (the last 3 months) is a period of the most rapid growth for you and your baby. The baby approaches a length of 20 inches and a weight of 6 to 10 pounds. The baby is adding on fat and getting ready for life outside your body. While inside, babies have periods of sleeping and waking, suck their thumbs, and hiccups. You can often feel small contractions of the uterus. This is false labor. It is also called Braxton-Hicks contractions. This is like a practice for labor. The usual problems in this stage of pregnancy include more difficulty breathing, swelling of the hands and feet from water retention, and having to urinate more often because of the uterus and baby pressing on your bladder.  PRENATAL EXAMS  Blood work may continue to be done during prenatal exams. These tests are done to check on your health and the probable health of your baby. Blood work is used to follow your blood levels (hemoglobin). Anemia (low hemoglobin) is common during pregnancy. Iron and vitamins are given to help prevent this. You may also continue to be checked for diabetes. Some of the past blood tests may be done again.   The size of the uterus is measured during each visit. This makes sure your baby is growing properly according to your pregnancy dates.   Your blood pressure is checked every prenatal visit. This is to make sure you are not getting toxemia.   Your urine is checked every prenatal visit for infection, diabetes and protein.   Your weight is checked at each visit. This is done to make sure gains are happening at the suggested rate and that you and your baby are growing normally.   Sometimes, an ultrasound is performed to confirm the position and the proper growth and development of the baby. This is a test done that bounces harmless sound waves off the baby so your caregiver can more accurately determine due dates.   Discuss the type of pain  medication and anesthesia you will have during your labor and delivery.   Discuss the possibility and anesthesia if a Cesarean Section might be necessary.   Inform your caregiver if there is any mental or physical violence at home.  Sometimes, a specialized non-stress test, contraction stress test and biophysical profile are done to make sure the baby is not having a problem. Checking the amniotic fluid surrounding the baby is called an amniocentesis. The amniotic fluid is removed by sticking a needle into the belly (abdomen). This is sometimes done near the end of pregnancy if an early delivery is required. In this case, it is done to help make sure the baby's lungs are mature enough for the baby to live outside of the womb. If the lungs are not mature and it is unsafe to deliver the baby, an injection of cortisone medication is given to the mother 1 to 2 days before the delivery. This helps the baby's lungs mature and makes it safer to deliver the baby. CHANGES OCCURING IN THE THIRD TRIMESTER OF PREGNANCY Your body goes through many changes during pregnancy. They vary from person to person. Talk to your caregiver about changes you notice and are concerned about.  During the last trimester, you have probably had an increase in your appetite. It is normal to have cravings for certain foods. This varies from person to person and pregnancy to pregnancy.   You may begin to get stretch marks on your hips,   abdomen, and breasts. These are normal changes in the body during pregnancy. There are no exercises or medications to take which prevent this change.   Constipation may be treated with a stool softener or adding bulk to your diet. Drinking lots of fluids, fiber in vegetables, fruits, and whole grains are helpful.   Exercising is also helpful. If you have been very active up until your pregnancy, most of these activities can be continued during your pregnancy. If you have been less active, it is helpful  to start an exercise program such as walking. Consult your caregiver before starting exercise programs.   Avoid all smoking, alcohol, un-prescribed drugs, herbs and "street drugs" during your pregnancy. These chemicals affect the formation and growth of the baby. Avoid chemicals throughout the pregnancy to ensure the delivery of a healthy infant.   Backache, varicose veins and hemorrhoids may develop or get worse.   You will tire more easily in the third trimester, which is normal.   The baby's movements may be stronger and more often.   You may become short of breath easily.   Your belly button may stick out.   A yellow discharge may leak from your breasts called colostrum.   You may have a bloody mucus discharge. This usually occurs a few days to a week before labor begins.  HOME CARE INSTRUCTIONS   Keep your caregiver's appointments. Follow your caregiver's instructions regarding medication use, exercise, and diet.   During pregnancy, you are providing food for you and your baby. Continue to eat regular, well-balanced meals. Choose foods such as meat, fish, milk and other low fat dairy products, vegetables, fruits, and whole-grain breads and cereals. Your caregiver will tell you of the ideal weight gain.   A physical sexual relationship may be continued throughout pregnancy if there are no other problems such as early (premature) leaking of amniotic fluid from the membranes, vaginal bleeding, or belly (abdominal) pain.   Exercise regularly if there are no restrictions. Check with your caregiver if you are unsure of the safety of your exercises. Greater weight gain will occur in the last 2 trimesters of pregnancy. Exercising helps:   Control your weight.   Get you in shape for labor and delivery.   You lose weight after you deliver.   Rest a lot with legs elevated, or as needed for leg cramps or low back pain.   Wear a good support or jogging bra for breast tenderness during  pregnancy. This may help if worn during sleep. Pads or tissues may be used in the bra if you are leaking colostrum.   Do not use hot tubs, steam rooms, or saunas.   Wear your seat belt when driving. This protects you and your baby if you are in an accident.   Avoid raw meat, cat litter boxes and soil used by cats. These carry germs that can cause birth defects in the baby.   It is easier to loose urine during pregnancy. Tightening up and strengthening the pelvic muscles will help with this problem. You can practice stopping your urination while you are going to the bathroom. These are the same muscles you need to strengthen. It is also the muscles you would use if you were trying to stop from passing gas. You can practice tightening these muscles up 10 times a set and repeating this about 3 times per day. Once you know what muscles to tighten up, do not perform these exercises during urination. It is more likely   to cause an infection by backing up the urine.   Ask for help if you have financial, counseling or nutritional needs during pregnancy. Your caregiver will be able to offer counseling for these needs as well as refer you for other special needs.   Make a list of emergency phone numbers and have them available.   Plan on getting help from family or friends when you go home from the hospital.   Make a trial run to the hospital.   Take prenatal classes with the father to understand, practice and ask questions about the labor and delivery.   Prepare the baby's room/nursery.   Do not travel out of the city unless it is absolutely necessary and with the advice of your caregiver.   Wear only low or no heal shoes to have better balance and prevent falling.  MEDICATIONS AND DRUG USE IN PREGNANCY  Take prenatal vitamins as directed. The vitamin should contain 1 milligram of folic acid. Keep all vitamins out of reach of children. Only a couple vitamins or tablets containing iron may be fatal  to a baby or young child when ingested.   Avoid use of all medications, including herbs, over-the-counter medications, not prescribed or suggested by your caregiver. Only take over-the-counter or prescription medicines for pain, discomfort, or fever as directed by your caregiver. Do not use aspirin, ibuprofen (Motrin, Advil, Nuprin) or naproxen (Aleve) unless OK'd by your caregiver.   Let your caregiver also know about herbs you may be using.   Alcohol is related to a number of birth defects. This includes fetal alcohol syndrome. All alcohol, in any form, should be avoided completely. Smoking will cause low birth rate and premature babies.   Street/illegal drugs are very harmful to the baby. They are absolutely forbidden. A baby born to an addicted mother will be addicted at birth. The baby will go through the same withdrawal an adult does.  SEEK MEDICAL CARE IF: You have any concerns or worries during your pregnancy. It is better to call with your questions if you feel they cannot wait, rather than worry about them. DECISIONS ABOUT CIRCUMCISION You may or may not know the sex of your baby. If you know your baby is a boy, it may be time to think about circumcision. Circumcision is the removal of the foreskin of the penis. This is the skin that covers the sensitive end of the penis. There is no proven medical need for this. Often this decision is made on what is popular at the time or based upon religious beliefs and social issues. You can discuss these issues with your caregiver or pediatrician. SEEK IMMEDIATE MEDICAL CARE IF:   An unexplained oral temperature above 102 F (38.9 C) develops, or as your caregiver suggests.   You have leaking of fluid from the vagina (birth canal). If leaking membranes are suspected, take your temperature and tell your caregiver of this when you call.   There is vaginal spotting, bleeding or passing clots. Tell your caregiver of the amount and how many pads are  used.   You develop a bad smelling vaginal discharge with a change in the color from clear to white.   You develop vomiting that lasts more than 24 hours.   You develop chills or fever.   You develop shortness of breath.   You develop burning on urination.   You loose more than 2 pounds of weight or gain more than 2 pounds of weight or as suggested by your   caregiver.   You notice sudden swelling of your face, hands, and feet or legs.   You develop belly (abdominal) pain. Round ligament discomfort is a common non-cancerous (benign) cause of abdominal pain in pregnancy. Your caregiver still must evaluate you.   You develop a severe headache that does not go away.   You develop visual problems, blurred or double vision.   If you have not felt your baby move for more than 1 hour. If you think the baby is not moving as much as usual, eat something with sugar in it and lie down on your left side for an hour. The baby should move at least 4 to 5 times per hour. Call right away if your baby moves less than that.   You fall, are in a car accident or any kind of trauma.   There is mental or physical violence at home.  Document Released: 05/06/2001 Document Revised: 05/01/2011 Document Reviewed: 11/08/2008 [employer] Patient Information 2012 [employer]. 

## 2011-10-06 NOTE — Progress Notes (Signed)
   Subjective:    Sandra House is a G1P0 [redacted]w[redacted]d being seen today for her first obstetrical visit.  Her obstetrical history is significant for obesity and hypertension, hypothyroid, and type 2 diabetes.. Patient does intend to breast feed. Pregnancy history fully reviewed.  Patient reports no bleeding, no contractions, no cramping and no leaking.  Filed Vitals:   10/06/11 0824 10/06/11 0830  BP: 139/84   Temp: 96 F (35.6 C)   Height:  5\' 2"  (1.575 m)  Weight: 360 lb 4.8 oz (163.431 kg)     HISTORY: OB History    Grav Para Term Preterm Abortions TAB SAB Ect Mult Living   1              # Outc Date GA Lbr Len/2nd Wgt Sex Del Anes PTL Lv   1 CUR              Past Medical History  Diagnosis Date  . Thyroid disease   . Hypertension   . Diabetes mellitus   . Hypothyroidism    Past Surgical History  Procedure Date  . Wisdom tooth extraction    Family History  Problem Relation Age of Onset  . Cancer Paternal Aunt   . Cancer Paternal Uncle   . Diabetes Paternal Grandmother   . Diabetes Paternal Grandfather      Exam    Uterus:     Pelvic Exam:    Perineum: No Hemorrhoids, Normal Perineum   Vulva: normal, Bartholin's, Urethra, Skene's normal  System:     Skin: normal coloration and turgor, no rashes    Neurologic: oriented, normal   Extremities: normal strength, tone, and muscle mass   HEENT extra ocular movement intact   Neck supple and no masses   Cardiovascular: regular rate and rhythm, no murmurs or gallops   Respiratory:  appears well, vitals normal, no respiratory distress, acyanotic, normal RR, ear and throat exam is normal, neck free of mass or lymphadenopathy, chest clear, no wheezing, crepitations, rhonchi, normal symmetric air entry   Abdomen: soft, non-tender; bowel sounds normal; no masses,  no organomegaly      Assessment:    Pregnancy: G1P0 Patient Active Problem List  Diagnoses  . Diabetes mellitus, type 2  . Diabetes mellitus  complicating pregnancy, antepartum  . Hypertension  . Hypertension complicating pregnancy, childbirth and puerperium, antepartum  . Hypothyroid  . Hypothyroid in pregnancy, antepartum  . Supervision of high-risk pregnancy  . Morbid obesity        Plan:     Initial labs drawn. Prenatal vitamins. Problem list reviewed and updated.  Ultrasound discussed; fetal survey: ordered.  Follow up in 1 weeks. 50% of 45 min visit spent on counseling and coordination of care.  NST done - one variable deceleration with 20 minutes of normal category 1 tracing following.  Accelerations seen. 24hr urine.  D/c lisinopril.  Patient referred to MFM for [place] and counseling due to lisinopril use during pregnancy and organogenesis. HgA1c, TSH.   Sandra House Sandra House 10/06/2011

## 2011-10-06 NOTE — Progress Notes (Signed)
Pulse 98. Edema trace in feet. No pain; pressure in pelvic.

## 2011-10-06 NOTE — Progress Notes (Signed)
Diabetes Education:  Comes in with no prenatal care until 33 weeks.  Has been seen at the Urgent Care.  C/O having had low blood glucose over the last few weeks.  Has and Accu Chek meter. Has been checking blood glucose levels but does not have her meter with her today.  Completed a review of the diet and the monitoring procedure for GDM.  At times she appeared to drift off as going to sleep.  Was accompanied by a friend and she was paying attention.   On doing a random check, her blood glucose using my meter for an informal reading was 192.  She noted she had had breakfast with a big glass of juice.  Provided handouts:  Nutrition, Diabetes and Pregnancy and Carb Counting Guide.  She was instructed to check fasting and 2 hour post-meal blood glucose levels and to bring her meter and the glucose log book that I gave her to all her clinic appointments.  Maggie Olanda Downie, RN, RD, CDE

## 2011-10-07 ENCOUNTER — Encounter: Payer: Self-pay | Admitting: Family Medicine

## 2011-10-07 ENCOUNTER — Telehealth: Payer: Self-pay | Admitting: *Deleted

## 2011-10-07 ENCOUNTER — Other Ambulatory Visit: Payer: Self-pay | Admitting: Family Medicine

## 2011-10-07 LAB — GC/CHLAMYDIA PROBE AMP, GENITAL
Chlamydia, DNA Probe: NEGATIVE
GC Probe Amp, Genital: NEGATIVE

## 2011-10-07 LAB — OBSTETRIC PANEL
Basophils Relative: 0 % (ref 0–1)
Eosinophils Absolute: 0 10*3/uL (ref 0.0–0.7)
Eosinophils Relative: 1 % (ref 0–5)
Hemoglobin: 10.4 g/dL — ABNORMAL LOW (ref 12.0–15.0)
Hepatitis B Surface Ag: NEGATIVE
MCH: 22.8 pg — ABNORMAL LOW (ref 26.0–34.0)
MCHC: 31.7 g/dL (ref 30.0–36.0)
MCV: 71.8 fL — ABNORMAL LOW (ref 78.0–100.0)
Monocytes Relative: 5 % (ref 3–12)
Neutrophils Relative %: 69 % (ref 43–77)
Platelets: 104 10*3/uL — ABNORMAL LOW (ref 150–400)
Rh Type: POSITIVE

## 2011-10-07 LAB — WET PREP, GENITAL: Clue Cells Wet Prep HPF POC: NONE SEEN

## 2011-10-07 MED ORDER — FLUCONAZOLE 150 MG PO TABS: 150.0000 mg | ORAL_TABLET | Freq: Once | ORAL | Status: AC

## 2011-10-07 NOTE — Telephone Encounter (Signed)
Called pt and left message that I am calling with test result information. Please call back.

## 2011-10-07 NOTE — Telephone Encounter (Signed)
Message copied by Jill Side on Tue Oct 07, 2011  2:04 PM ------      Message from: Sandra House      Created: Tue Oct 07, 2011  1:38 PM       + yeast infection.  Diflucan sent to pharmacy.

## 2011-10-08 LAB — CULTURE, OB URINE

## 2011-10-08 NOTE — Telephone Encounter (Signed)
Called patient and left message we are calling with some information from your provider, please call office

## 2011-10-09 NOTE — Telephone Encounter (Signed)
Called patient and left a message that we are calling with results. Pt has an appt tomorrow morning for NST.

## 2011-10-10 ENCOUNTER — Other Ambulatory Visit: Payer: Self-pay

## 2011-10-10 ENCOUNTER — Ambulatory Visit (HOSPITAL_COMMUNITY): Admission: RE | Admit: 2011-10-10 | Payer: Self-pay | Source: Ambulatory Visit

## 2011-10-10 NOTE — Telephone Encounter (Signed)
Pt DNKA today for NST.  I called the home # and a friend answered the phone and stated that Nefartaree was not there but she knew that the pt was trying to call us to cancel her appt for today. She did not know why pt could not keep appt. I asked if she would give a message to pt that we have sent in a Rx to her pharmacy. She agreed. No further information or instructions given.

## 2011-10-13 ENCOUNTER — Ambulatory Visit (INDEPENDENT_AMBULATORY_CARE_PROVIDER_SITE_OTHER): Payer: Medicaid Other | Admitting: Obstetrics & Gynecology

## 2011-10-13 VITALS — BP 140/86 | Temp 97.2°F | Wt 351.0 lb

## 2011-10-13 DIAGNOSIS — O169 Unspecified maternal hypertension, unspecified trimester: Secondary | ICD-10-CM

## 2011-10-13 DIAGNOSIS — E119 Type 2 diabetes mellitus without complications: Secondary | ICD-10-CM

## 2011-10-13 DIAGNOSIS — O24919 Unspecified diabetes mellitus in pregnancy, unspecified trimester: Secondary | ICD-10-CM

## 2011-10-13 DIAGNOSIS — I10 Essential (primary) hypertension: Secondary | ICD-10-CM

## 2011-10-13 DIAGNOSIS — D696 Thrombocytopenia, unspecified: Secondary | ICD-10-CM

## 2011-10-13 LAB — COMPREHENSIVE METABOLIC PANEL
ALT: 15 U/L (ref 0–35)
AST: 16 U/L (ref 0–37)
Alkaline Phosphatase: 103 U/L (ref 39–117)
CO2: 21 mEq/L (ref 19–32)
Sodium: 137 mEq/L (ref 135–145)
Total Bilirubin: 0.3 mg/dL (ref 0.3–1.2)
Total Protein: 6.2 g/dL (ref 6.0–8.3)

## 2011-10-13 LAB — POCT URINALYSIS DIP (DEVICE)
Bilirubin Urine: NEGATIVE
Hgb urine dipstick: NEGATIVE
Ketones, ur: 15 mg/dL — AB
Protein, ur: NEGATIVE mg/dL
Specific Gravity, Urine: 1.01 (ref 1.005–1.030)
pH: 6 (ref 5.0–8.0)

## 2011-10-13 LAB — CBC
MCH: 22.8 pg — ABNORMAL LOW (ref 26.0–34.0)
MCHC: 31.8 g/dL (ref 30.0–36.0)
MCV: 71.7 fL — ABNORMAL LOW (ref 78.0–100.0)
Platelets: 112 10*3/uL — ABNORMAL LOW (ref 150–400)
RDW: 17.8 % — ABNORMAL HIGH (ref 11.5–15.5)

## 2011-10-13 MED ORDER — GLYBURIDE 2.5 MG PO TABS: 2.5000 mg | ORAL_TABLET | Freq: Every day | ORAL | Status: DC

## 2011-10-13 NOTE — Progress Notes (Signed)
Ob US scheduled 5/28 @ 0900

## 2011-10-13 NOTE — Progress Notes (Signed)
Nutrition Note:  (referral) Pt referred today for diabetic diet review.    Pt is currently not eating 3 meals and 3 snacks daily.   Pt is morbidly obese and is also gaining weight too fast.  Overall gain of 25# on 5/13, did lose down to 351 today.  Reviewed keys to Diabetic Diet.  Encouraged 3 meals and 3 snacks.  Pt agrees to make necessary changes and increase physical activity asap. Made [facility] appt for 10/30/11 @ 8:00. Follow up if referred.  Cy Blamer, RD

## 2011-10-13 NOTE — Progress Notes (Signed)
Addended by: Doreen Salvage on: 10/13/2011 11:17 AM   Modules accepted: Orders

## 2011-10-13 NOTE — Progress Notes (Signed)
NST reactive.  Turned in 24 hour urine today.  Did not bring CBG log.  CBG 89-105;  All post prandials are <110.  Today pt is fasting and CBG is 145.  Discussed the high CBG and risks of hyperglycemia to fetal death.  Will start glyburide 2.5 mg qhs.  Pt to show cbg log to RN at next antenatal visit.  Growth next Tuesday.  Morbidly obese and unable to get fundal height.  Will rely on [place] for growth.  Pt had [place] on May 13th scheduled but canceled appt.

## 2011-10-13 NOTE — Progress Notes (Signed)
Pulse- 95  Edema-feet, nose

## 2011-10-14 ENCOUNTER — Encounter: Payer: Self-pay | Admitting: Obstetrics & Gynecology

## 2011-10-14 DIAGNOSIS — O1213 Gestational proteinuria, third trimester: Secondary | ICD-10-CM | POA: Insufficient documentation

## 2011-10-14 LAB — CREATININE CLEARANCE, URINE, 24 HOUR
Creatinine Clearance: 334 mL/min — ABNORMAL HIGH (ref 75–115)
Creatinine, 24H Ur: 2167 mg/d — ABNORMAL HIGH (ref 700–1800)

## 2011-10-14 LAB — PROTEIN, URINE, 24 HOUR
Protein, 24H Urine: 544 mg/d — ABNORMAL HIGH (ref 50–100)
Protein, Urine: 17 mg/dL

## 2011-10-15 ENCOUNTER — Inpatient Hospital Stay (HOSPITAL_COMMUNITY)
Admission: AD | Admit: 2011-10-15 | Discharge: 2011-10-15 | Disposition: A | Discharge status: Home / Self Care | Payer: Medicaid Other | Source: Ambulatory Visit | Attending: Obstetrics & Gynecology | Admitting: Obstetrics & Gynecology

## 2011-10-15 ENCOUNTER — Inpatient Hospital Stay (HOSPITAL_COMMUNITY): Payer: Medicaid Other

## 2011-10-15 ENCOUNTER — Encounter (HOSPITAL_COMMUNITY): Payer: Self-pay | Admitting: *Deleted

## 2011-10-15 DIAGNOSIS — D696 Thrombocytopenia, unspecified: Secondary | ICD-10-CM | POA: Insufficient documentation

## 2011-10-15 DIAGNOSIS — O0930 Supervision of pregnancy with insufficient antenatal care, unspecified trimester: Secondary | ICD-10-CM | POA: Insufficient documentation

## 2011-10-15 DIAGNOSIS — R1031 Right lower quadrant pain: Secondary | ICD-10-CM | POA: Insufficient documentation

## 2011-10-15 DIAGNOSIS — O10019 Pre-existing essential hypertension complicating pregnancy, unspecified trimester: Secondary | ICD-10-CM | POA: Insufficient documentation

## 2011-10-15 DIAGNOSIS — O26899 Other specified pregnancy related conditions, unspecified trimester: Secondary | ICD-10-CM

## 2011-10-15 DIAGNOSIS — R1013 Epigastric pain: Secondary | ICD-10-CM

## 2011-10-15 DIAGNOSIS — O9989 Other specified diseases and conditions complicating pregnancy, childbirth and the puerperium: Secondary | ICD-10-CM

## 2011-10-15 DIAGNOSIS — O10919 Unspecified pre-existing hypertension complicating pregnancy, unspecified trimester: Secondary | ICD-10-CM

## 2011-10-15 DIAGNOSIS — O99119 Other diseases of the blood and blood-forming organs and certain disorders involving the immune mechanism complicating pregnancy, unspecified trimester: Secondary | ICD-10-CM | POA: Insufficient documentation

## 2011-10-15 DIAGNOSIS — O0933 Supervision of pregnancy with insufficient antenatal care, third trimester: Secondary | ICD-10-CM

## 2011-10-15 DIAGNOSIS — D689 Coagulation defect, unspecified: Secondary | ICD-10-CM | POA: Insufficient documentation

## 2011-10-15 DIAGNOSIS — O26839 Pregnancy related renal disease, unspecified trimester: Secondary | ICD-10-CM | POA: Insufficient documentation

## 2011-10-15 DIAGNOSIS — R1032 Left lower quadrant pain: Secondary | ICD-10-CM | POA: Insufficient documentation

## 2011-10-15 LAB — CBC
MCH: 23.1 pg — ABNORMAL LOW (ref 26.0–34.0)
MCV: 72.3 fL — ABNORMAL LOW (ref 78.0–100.0)
Platelets: 95 10*3/uL — ABNORMAL LOW (ref 150–400)
RDW: 16.6 % — ABNORMAL HIGH (ref 11.5–15.5)
WBC: 8.5 10*3/uL (ref 4.0–10.5)

## 2011-10-15 LAB — COMPREHENSIVE METABOLIC PANEL
AST: 25 U/L (ref 0–37)
Albumin: 2.8 g/dL — ABNORMAL LOW (ref 3.5–5.2)
Calcium: 9.4 mg/dL (ref 8.4–10.5)
Creatinine, Ser: 0.49 mg/dL — ABNORMAL LOW (ref 0.50–1.10)
Total Protein: 6.7 g/dL (ref 6.0–8.3)

## 2011-10-15 LAB — AMYLASE: Amylase: 50 U/L (ref 0–105)

## 2011-10-15 MED ORDER — PANTOPRAZOLE SODIUM 40 MG PO TBEC
40.0000 mg | DELAYED_RELEASE_TABLET | Freq: Once | ORAL | Status: AC
  Administered 2011-10-15: 40 mg via ORAL
  Filled 2011-10-15: qty 1

## 2011-10-15 MED ORDER — OXYCODONE-ACETAMINOPHEN 5-325 MG PO TABS
2.0000 | ORAL_TABLET | Freq: Once | ORAL | Status: AC
  Administered 2011-10-15: 2 via ORAL
  Filled 2011-10-15: qty 2

## 2011-10-15 MED ORDER — CEFAZOLIN SODIUM-DEXTROSE 2-3 GM-% IV SOLR: 2.0000 g | Freq: Once | INTRAVENOUS | Status: DC

## 2011-10-15 MED ORDER — OXYCODONE-ACETAMINOPHEN 5-325 MG PO TABS: 2.0000 | ORAL_TABLET | Freq: Once | ORAL | Status: AC

## 2011-10-15 MED ORDER — GI COCKTAIL ~~LOC~~
30.0000 mL | Freq: Once | ORAL | Status: AC
  Administered 2011-10-15: 30 mL via ORAL
  Filled 2011-10-15: qty 30

## 2011-10-15 MED ORDER — FAMOTIDINE 20 MG PO TABS: 20.0000 mg | ORAL_TABLET | Freq: Two times a day (BID) | ORAL | Status: DC

## 2011-10-15 NOTE — MAU Provider Note (Signed)
History     CSN: [id number]  Arrival date and time: 10/15/11 1538   First Provider Initiated Contact with Patient 10/15/11 1653      Chief Complaint  Patient presents with  . Abdominal Pain   HPI Comments: Patient is a 21 y/o G1P0 AAF with h/o T2DM and Hypothyroidism presents todday c/o sharp pains in her abdomen. Onset was 0300 today and have persisted intermittently. States she was lying in bed when she felt a sharp pain with constriction in her epigastric region and towards the LL and RL quadrants. RLQ >LLQ.  9/10 in severity. One episode of vomiting after symptoms began. Has not taken anything for her pain. LOI was 0801.  Abdominal Pain Associated symptoms include constipation, headaches, nausea and vomiting. Pertinent negatives include no diarrhea, fever or melena.    OB History    Grav Para Term Preterm Abortions TAB SAB Ect Mult Living   1               Past Medical History  Diagnosis Date  . Thyroid disease   . Hypertension   . Diabetes mellitus   . Hypothyroidism     Past Surgical History  Procedure Date  . Wisdom tooth extraction     Family History  Problem Relation Age of Onset  . Cancer Paternal Aunt   . Cancer Paternal Uncle   . Diabetes Paternal Grandmother   . Diabetes Paternal Grandfather   . Anesthesia problems Neg Hx     History  Substance Use Topics  . Smoking status: Never Smoker   . Smokeless tobacco: Never Used  . Alcohol Use: No    Allergies: No Known Allergies  Prescriptions prior to admission  Medication Sig Dispense Refill  . flintstones complete (FLINTSTONES) 60 MG chewable tablet Chew 1 tablet by mouth every morning.      . glyBURIDE (DIABETA) 2.5 MG tablet Take 2.5 mg by mouth at bedtime.      Marland Kitchen levothyroxine (SYNTHROID, LEVOTHROID) 50 MCG tablet Take 50 mcg by mouth daily.      . metFORMIN (GLUCOPHAGE) 500 MG tablet Take 500 mg by mouth 2 (two) times daily with a meal.      . DISCONTD: glyBURIDE (DIABETA) 2.5 MG tablet Take  1 tablet (2.5 mg total) by mouth at bedtime.  30 tablet  3    Review of Systems  Constitutional: Negative for fever and chills.  HENT: Negative for hearing loss and tinnitus.   Eyes: Positive for blurred vision.       One episode of blurry vision- resolved   Gastrointestinal: Positive for nausea, vomiting, abdominal pain and constipation. Negative for diarrhea, blood in stool and melena.  Genitourinary: Negative.   Musculoskeletal:       Foot pain.  Neurological: Positive for headaches.   Physical Exam   Blood pressure 135/73, pulse 106, temperature 97.7 F (36.5 C), temperature source Oral, resp. rate 20, height 5\' 3"  (1.6 m), weight 160.483 kg (353 lb 12.8 oz), SpO2 99.00%.  Physical Exam  Constitutional: She is oriented to person, place, and time. She appears well-developed and well-nourished. No distress.       Obese   HENT:  Head: Normocephalic and atraumatic.  Cardiovascular: Regular rhythm and intact distal pulses.   Pulses:      Radial pulses are 2+ on the right side, and 2+ on the left side.       Dorsalis pedis pulses are 2+ on the right side, and 2+ on the left side.  Posterior tibial pulses are 2+ on the right side, and 2+ on the left side.  GI: Soft. She exhibits distension and mass. There is no hepatomegaly. There is tenderness in the right lower quadrant, epigastric area, periumbilical area and left lower quadrant. There is no rigidity, no rebound, no guarding, no tenderness at McBurney's point and negative Murphy's sign.  Neurological: She is alert and oriented to person, place, and time.  Reflex Scores:      Tricep reflexes are 2+ on the right side and 1+ on the left side.      Bicep reflexes are 2+ on the right side and 2+ on the left side.      Brachioradialis reflexes are 2+ on the right side and 2+ on the left side.      Patellar reflexes are 2+ on the right side and 2+ on the left side.      Achilles reflexes are 2+ on the right side and 2+ on the left  side. Skin: Skin is warm and dry. She is not diaphoretic.   FHR 140's , minimal variability, + accels, - decels. Category I Toco: UI Pelvic: deferred  24 hour Urine 544 on 10/13/11. Results for orders placed during the hospital encounter of 10/15/11 (from the past 24 hour(s))  CBC     Status: Abnormal   Collection Time   10/15/11  5:30 PM      Component Value Range   WBC 8.5  4.0 - 10.5 (K/uL)   RBC 4.76  3.87 - 5.11 (MIL/uL)   Hemoglobin 11.0 (*) 12.0 - 15.0 (g/dL)   HCT 63.0 (*) 16.0 - 46.0 (%)   MCV 72.3 (*) 78.0 - 100.0 (fL)   MCH 23.1 (*) 26.0 - 34.0 (pg)   MCHC 32.0  30.0 - 36.0 (g/dL)   RDW 10.9 (*) 32.3 - 15.5 (%)   Platelets 95 (*) 150 - 400 (K/uL)  COMPREHENSIVE METABOLIC PANEL     Status: Abnormal   Collection Time   10/15/11  5:30 PM      Component Value Range   Sodium 134 (*) 135 - 145 (mEq/L)   Potassium 3.8  3.5 - 5.1 (mEq/L)   Chloride 98  96 - 112 (mEq/L)   CO2 25  19 - 32 (mEq/L)   Glucose, Bld 109 (*) 70 - 99 (mg/dL)   BUN 4 (*) 6 - 23 (mg/dL)   Creatinine, Ser 5.57 (*) 0.50 - 1.10 (mg/dL)   Calcium 9.4  8.4 - 32.2 (mg/dL)   Total Protein 6.7  6.0 - 8.3 (g/dL)   Albumin 2.8 (*) 3.5 - 5.2 (g/dL)   AST 25  0 - 37 (U/L)   ALT 18  0 - 35 (U/L)   Alkaline Phosphatase 104  39 - 117 (U/L)   Total Bilirubin 0.3  0.3 - 1.2 (mg/dL)   GFR calc non Af Amer >90  >90 (mL/min)   GFR calc Af Amer >90  >90 (mL/min)  LIPASE, BLOOD     Status: Normal   Collection Time   10/15/11  5:30 PM      Component Value Range   Lipase 28  11 - 59 (U/L)  AMYLASE     Status: Normal   Collection Time   10/15/11  5:30 PM      Component Value Range   Amylase 50  0 - 105 (U/L)   [place]: EFW 5-11, C/W [place] 09/25/11, AFI normal, vtx, HC 4%. Anatomy limited by advanced gestation and body habitus.  BPP 8/8  MAU Course  Procedures Pain improved w/ GI cocktail, then returned.  Protonix and percocet given   Assessment and Plan   1. Pregnancy with epigastric pain, antepartum   2.  Hypertension in pregnancy, pre-existing, antepartum vs pre-eclampsia   3. Insufficient prenatal care, third trimester   4. Thrombocytopenia complicating pregnancy   5.  Proteinuria in pregnancy  D/C home per consult w/ Dr. Despina Hidden Repeat 24 hour urine PIH and PTL precautions FKCs Follow-up Information    Follow up with [facility] on 10/17/2011. (for NST and turn in 24 hour urine)    Contact information:   [address] [place] [address]       Follow up with [facility]. (As needed if symptoms worsen)    Contact information:   [address] [place] [address] [phone]        Medication List  As of 10/15/2011  8:43 PM   START taking these medications         famotidine 20 MG tablet   Commonly known as: PEPCID   Take 1 tablet (20 mg total) by mouth 2 (two) times daily.      oxyCODONE-acetaminophen 5-325 MG per tablet   Commonly known as: PERCOCET   Take 2 tablets by mouth once.         CONTINUE taking these medications         flintstones complete 60 MG chewable tablet      glyBURIDE 2.5 MG tablet   Commonly known as: DIABETA      levothyroxine 50 MCG tablet   Commonly known as: SYNTHROID, LEVOTHROID      metFORMIN 500 MG tablet   Commonly known as: GLUCOPHAGE          Where to get your medications    These are the prescriptions that you need to pick up.   You may get these medications from any pharmacy.         famotidine 20 MG tablet   oxyCODONE-acetaminophen 5-325 MG per tablet           Whetstone, Jared 10/15/2011, 5:23 PM   I was present for the exam and agree with above.  Dorathy Kinsman 10/15/2011 8:40 PM

## 2011-10-15 NOTE — MAU Note (Signed)
Pt in c/o sharp lower abdominal pains since early this morning. Worse with movement.  Denies any bleeding, discharge, or leaking of fluid.  + FM.

## 2011-10-15 NOTE — MAU Note (Signed)
Patient states she started having having mid to lower abdominal pain about 0300 this am. States worse with moving and lying down. Reports good fetal movement, no leaking or bleeding.

## 2011-10-15 NOTE — Discharge Instructions (Signed)

## 2011-10-16 DIAGNOSIS — O26899 Other specified pregnancy related conditions, unspecified trimester: Secondary | ICD-10-CM

## 2011-10-16 DIAGNOSIS — K219 Gastro-esophageal reflux disease without esophagitis: Secondary | ICD-10-CM

## 2011-10-17 ENCOUNTER — Encounter (HOSPITAL_COMMUNITY): Payer: Self-pay | Admitting: *Deleted

## 2011-10-17 ENCOUNTER — Telehealth: Payer: Self-pay

## 2011-10-17 ENCOUNTER — Inpatient Hospital Stay (HOSPITAL_COMMUNITY)
Admission: AD | Admit: 2011-10-17 | Discharge: 2011-10-17 | Disposition: A | Discharge status: Home / Self Care | Payer: Medicaid Other | Source: Ambulatory Visit | Attending: Obstetrics & Gynecology | Admitting: Obstetrics & Gynecology

## 2011-10-17 DIAGNOSIS — D689 Coagulation defect, unspecified: Secondary | ICD-10-CM | POA: Insufficient documentation

## 2011-10-17 DIAGNOSIS — E669 Obesity, unspecified: Secondary | ICD-10-CM | POA: Insufficient documentation

## 2011-10-17 DIAGNOSIS — O99119 Other diseases of the blood and blood-forming organs and certain disorders involving the immune mechanism complicating pregnancy, unspecified trimester: Secondary | ICD-10-CM | POA: Insufficient documentation

## 2011-10-17 DIAGNOSIS — E119 Type 2 diabetes mellitus without complications: Secondary | ICD-10-CM | POA: Insufficient documentation

## 2011-10-17 DIAGNOSIS — O99891 Other specified diseases and conditions complicating pregnancy: Secondary | ICD-10-CM | POA: Insufficient documentation

## 2011-10-17 DIAGNOSIS — R109 Unspecified abdominal pain: Secondary | ICD-10-CM | POA: Insufficient documentation

## 2011-10-17 DIAGNOSIS — K219 Gastro-esophageal reflux disease without esophagitis: Secondary | ICD-10-CM | POA: Insufficient documentation

## 2011-10-17 DIAGNOSIS — O24919 Unspecified diabetes mellitus in pregnancy, unspecified trimester: Secondary | ICD-10-CM | POA: Insufficient documentation

## 2011-10-17 DIAGNOSIS — O99619 Diseases of the digestive system complicating pregnancy, unspecified trimester: Secondary | ICD-10-CM

## 2011-10-17 DIAGNOSIS — R1013 Epigastric pain: Secondary | ICD-10-CM | POA: Insufficient documentation

## 2011-10-17 DIAGNOSIS — D696 Thrombocytopenia, unspecified: Secondary | ICD-10-CM | POA: Insufficient documentation

## 2011-10-17 DIAGNOSIS — O99210 Obesity complicating pregnancy, unspecified trimester: Secondary | ICD-10-CM | POA: Insufficient documentation

## 2011-10-17 LAB — URINALYSIS, ROUTINE W REFLEX MICROSCOPIC
Leukocytes, UA: NEGATIVE
Nitrite: NEGATIVE
Specific Gravity, Urine: 1.015 (ref 1.005–1.030)
pH: 6 (ref 5.0–8.0)

## 2011-10-17 LAB — CBC
HCT: 34 % — ABNORMAL LOW (ref 36.0–46.0)
MCH: 22.7 pg — ABNORMAL LOW (ref 26.0–34.0)
MCHC: 31.5 g/dL (ref 30.0–36.0)
RDW: 16.5 % — ABNORMAL HIGH (ref 11.5–15.5)

## 2011-10-17 LAB — PROTEIN / CREATININE RATIO, URINE
Protein Creatinine Ratio: 0.29 — ABNORMAL HIGH (ref 0.00–0.15)
Total Protein, Urine: 21 mg/dL

## 2011-10-17 LAB — URINE MICROSCOPIC-ADD ON

## 2011-10-17 MED ORDER — GI COCKTAIL ~~LOC~~
30.0000 mL | ORAL | Status: AC
  Administered 2011-10-17: 30 mL via ORAL
  Filled 2011-10-17 (×2): qty 30

## 2011-10-17 MED ORDER — ONDANSETRON 8 MG PO TBDP
8.0000 mg | ORAL_TABLET | ORAL | Status: AC
  Administered 2011-10-17: 8 mg via ORAL
  Filled 2011-10-17: qty 1

## 2011-10-17 NOTE — MAU Note (Signed)
PT SAYS  SHE WAS HERE ON 5-21- FOR SHARP ABD PAIN.  RAN SOME TESTS-  TOLD HER TO RETURN IF  VOMITING  AND H/A.   VOMITING STARTED  AT [place] WHEN SHE HAD UPPER ABD PAIN-- HEADACHE STARTED SAME TIME.   SHE DID NOT TAKE ANY MED.

## 2011-10-17 NOTE — Telephone Encounter (Signed)
Sandra House, CNM from MAU called and informed me that per Dr.  Penne Lash the pt needed to be called and informed to come to our office for [facility] labs and pick up materials for a 24 hour urine ( 2 jugs).  Called pt and informed pt that per Sandra House from MAU that we needed to her come in today at the clinics to collect the materials for her 24 hour urine and get labs drawn.  I also let pt know that we close today at noon if she could make by that time.  Pt stated that she was not going to come.  I emphasized on how important it was for her to come pick the materials and complete to monitor for possible preeclampsia that can be very dangerous for her and the baby.  Pt then stated that she would go to MAU but I let her know that she would need to wait like she going to be seen vs.  Coming to the clinics and she will be able to get in and out.  Pt informed me that she will come to the clinics around 11 but before we close @ noon.  I informed TT of what pt needed.  Will inbasket Dr. Penne Lash that due to it being the weekend and a holiday on Monday TT will draw PIH labs when pt comes in to the NST appt on Tuesday.

## 2011-10-17 NOTE — Discharge Instructions (Signed)
Heartburn During Pregnancy  Heartburn is a burning sensation in the chest caused by stomach acid backing up into the esophagus. Heartburn (also known as "reflux") is common in pregnancy because a certain hormone (progesterone) changes. The progesterone hormone may relax the valve that separates the esophagus from the stomach. This allows acid to go up into the esophagus, causing heartburn. Heartburn may also happen in pregnancy because the enlarging uterus pushes up on the stomach, which pushes more acid into the esophagus. This is especially true in the later stages of pregnancy. Heartburn problems usually go away after giving birth. CAUSES   The progesterone hormone.   Changing hormone levels.   The growing uterus that pushes stomach acid upward.   Large meals.   Certain foods and drinks.   Exercise.   Increased acid production.  SYMPTOMS   Burning pain in the chest or lower throat.   Bitter taste in the mouth.   Coughing.  DIAGNOSIS  Heartburn is typically diagnosed by your caregiver when taking a careful history of your concern. Your caregiver may order a blood test to check for a certain type of bacteria that is associated with heartburn. Sometimes, heartburn is diagnosed by prescribing a heartburn medicine to see if the symptoms improve. It is rare in pregnancy to have a procedure called an endoscopy. This is when a tube with a light and a camera on the end is used to examine the esophagus and the stomach. TREATMENT   Your caregiver may tell you to use certain over-the-counter medicines (antacids, acid reducers) for mild heartburn.   Your caregiver may prescribe medicines to decrease stomach acid or to protect your stomach lining.   Your caregiver may recommend certain diet changes.   For severe cases, your caregiver may recommend that the head of the bed be elevated on blocks. (Sleeping with more pillows is not an effective treatment as it only changes the position of your  head and does not improve the main problem of stomach acid refluxing into the esophagus.)  HOME CARE INSTRUCTIONS   Take all medicines as directed by your caregiver.   Raise the head of your bed by putting blocks under the legs if instructed to by your caregiver.   Do not exercise right after eating.   Avoid eating 2 or 3 hours before bed. Do not lie down right after eating.   Eat small meals throughout the day instead of 3 large meals.   Identify foods and beverages that make your symptoms worse and avoid them. Foods you may want to avoid include:   Peppers.   Chocolate.   High-fat foods, including fried foods.   Spicy foods.   Garlic and onions.   Citrus fruits, including oranges, grapefruit, lemons, and limes.   Food containing tomatoes or tomato products.   Mint.   Carbonated and caffeinated drinks.   Vinegar.  SEEK IMMEDIATE MEDICAL CARE IF:   You have severe chest pain that goes down your arm or into your jaw or neck.   You feel sweaty, dizzy, or lightheaded.   You become short of breath.   You vomit blood.   You have difficulty or pain with swallowing.   You have bloody or black, tarry stools.   You have episodes of heartburn more than 3 times a week, for more than 2 weeks.  MAKE SURE YOU:  Understand these instructions.   Will watch your condition.   Will get help right away if you are not doing well or   get worse.  Document Released: 05/09/2000 Document Revised: 05/01/2011 Document Reviewed: 10/31/2010 [facility] Patient Information 2012 [place], [place]. Hypertension During Pregnancy Hypertension is also called high blood pressure. It can occur at any time in life and during pregnancy. When you have hypertension, there is extra pressure inside your blood vessels that carry blood from the heart to the rest of your body (arteries). Hypertension during pregnancy can cause problems for you and your baby. Your baby might not weigh as much as it should at  birth or might be born early (premature). Very bad cases of hypertension during pregnancy can be life-threatening.  There are different types of hypertension during pregnancy.   Chronic hypertension. This happens when a woman has hypertension before pregnancy and it continues during pregnancy.   Gestational hypertension. This is when hypertension develops during pregnancy.   Preeclampsia or toxemia of pregnancy. This is a very serious type of hypertension that develops only during pregnancy. It is a disease that affects the whole body (systemic) and can be very dangerous for both mother and baby.   Gestational hypertension and preeclampsia usually go away after your baby is born. Blood pressure generally stabilizes within 6 weeks. Women who have hypertension during pregnancy have a greater chance of developing hypertension later in life or with future pregnancies. UNDERSTANDING BLOOD PRESSURE Blood pressure moves blood in your body. Sometimes, the force that moves the blood becomes too strong.  A blood pressure reading is given in 2 numbers and looks like a fraction.   The top number is called the systolic pressure. When your heart beats, it forces more blood to flow through the arteries. Pressure inside the arteries goes up.   The bottom number is the diastolic pressure. Pressure goes down between beats. That is when the heart is resting.   You may have hypertension if:   Your systolic blood pressure is above 140.   Your diastolic pressure is above 90.  RISK FACTORS Some factors make you more likely to develop hypertension during pregnancy. Risk factors include:  Having hypertension before pregnancy.   Having hypertension during a previous pregnancy.   Being overweight.   Being older than 40.   Being pregnant with more than 1 baby (multiples).   Having diabetes or kidney problems.  SYMPTOMS Chronic and gestational hypertension may not cause symptoms. Preeclampsia has symptoms,  which may include:  Increased protein in your urine. Your caregiver will check for this at every prenatal visit.   Swelling of your hands and face.   Rapid weight gain.   Headaches.   Visual changes.   Being bothered by light.   Abdominal pain, especially in the right upper area.   Chest pain.   Shortness of breath.   Increased reflexes.   Seizures. Seizures occur with a more severe form of preeclampsia, called eclampsia.  DIAGNOSIS   You may be diagnosed with hypertension during pregnancy during a regular prenatal exam. At each visit, tests may include:   Blood pressure checks.   A urine test to check for protein in your urine.   The type of hypertension you are diagnosed with depends on when you developed it. It also depends on your specific blood pressure reading.   Developing hypertension before 20 weeks of pregnancy is consistent with chronic hypertension.   Developing hypertension after 20 weeks of pregnancy is consistent with gestational hypertension.   Hypertension with increased urinary protein is diagnosed as preeclampsia.   Blood pressure measurements that stay above 160 systolic or  110 diastolic are a sign of severe preeclampsia.  TREATMENT Treatment for hypertension during pregnancy varies. Treatment depends on the type of hypertension and how serious it is.  If you take medicine for chronic hypertension, you may need to switch medicines.   Drugs called ACE inhibitors should not be taken during pregnancy.   Low-dose aspirin may be suggested for women who have risk factors for preeclampsia.   If you have gestational hypertension, you may need to take a blood pressure medicine that is safe during pregnancy. Your caregiver will recommend the appropriate medicine.   If you have severe preeclampsia, you may need to be in the hospital. Caregivers will watch you and the baby very closely. You also may need to take medicine (magnesium sulfate) to prevent  seizures and lower blood pressure.   Sometimes an early delivery is needed. This may be the case if the condition worsens. It would be done to protect you and the baby. The only cure for preeclampsia is delivery.  HOME CARE INSTRUCTIONS  Schedule and keep all of your regular prenatal care.   Follow your caregiver's instructions for taking medicines. Tell your caregiver about all medicines you take. This includes over-the-counter medicines.   Eat as little salt as possible.   Get regular exercise.   Do not drink alcohol.   Do not use tobacco products.   Do not drink products with caffeine.   Lie on your left side when resting.   Tell your doctor if you have any preeclampsia symptoms.  SEEK IMMEDIATE MEDICAL CARE IF:  You have severe abdominal pain.   You have sudden swelling in the hands, ankles, or face.   You gain 4 pounds (1.8 kg) or more in 1 week.   You vomit repeatedly.   You have vaginal bleeding.   You do not feel the baby moving as much.   You have a headache.   You have blurred or double vision.   You have muscle twitching or spasms.   You have shortness of breath.   You have blue fingernails and lips.   You have blood in your urine.  MAKE SURE YOU:  Understand these instructions.   Will watch your condition.   Will get help right away if you are not doing well.  Document Released: 01/28/2011 Document Revised: 05/01/2011 Document Reviewed: 01/28/2011 [facility] Patient Information 2012 [place], [place].

## 2011-10-17 NOTE — MAU Provider Note (Signed)
Chief Complaint:  Abdominal Pain   First Provider Initiated Contact with Patient 10/17/11 0215      HPI  Sandra House is a 21 y.o. G1P0 at [redacted]w[redacted]d presenting with upper abdominal/epigastric pain.  She reports this pain is the same as her pain on 5/20 when she was treated in MAU.  She has not picked up her prescribed medications because she did not have her ID when she went to the pharmacy and this was required.  She started a 24 hour urine to be completed this morning but she completely filled her first jug and stopped collecting after this. She planned to go to [facility] today to return 24 hour urine and complete an NST.  She was late to prenatal care at 32 weeks.  She reports good fetal movement, denies h/a, visual disturbances, n/v, LOF, vaginal bleeding, vaginal itching/burning, urinary symptoms, dizziness, or fever/chills.     Pregnancy Course: uncomplicated  Past Medical History: Past Medical History  Diagnosis Date  . Thyroid disease   . Hypertension   . Diabetes mellitus   . Hypothyroidism     Past Surgical History: Past Surgical History  Procedure Date  . Wisdom tooth extraction     Family History: Family History  Problem Relation Age of Onset  . Cancer Paternal Aunt   . Cancer Paternal Uncle   . Diabetes Paternal Grandmother   . Diabetes Paternal Grandfather   . Anesthesia problems Neg Hx     Social History: History  Substance Use Topics  . Smoking status: Never Smoker   . Smokeless tobacco: Never Used  . Alcohol Use: No    Allergies: No Known Allergies  Meds:  Prescriptions prior to admission  Medication Sig Dispense Refill  . famotidine (PEPCID) 20 MG tablet Take 1 tablet (20 mg total) by mouth 2 (two) times daily.  30 tablet  2  . flintstones complete (FLINTSTONES) 60 MG chewable tablet Chew 1 tablet by mouth every morning.      . glyBURIDE (DIABETA) 2.5 MG tablet Take 2.5 mg by mouth at bedtime.      Marland Kitchen levothyroxine (SYNTHROID, LEVOTHROID) 50 MCG  tablet Take 50 mcg by mouth daily.      . metFORMIN (GLUCOPHAGE) 500 MG tablet Take 500 mg by mouth 2 (two) times daily with a meal.      . oxyCODONE-acetaminophen (PERCOCET) 5-325 MG per tablet Take 2 tablets by mouth once.  20 tablet  0    Physical Exam  Blood pressure 135/75, pulse 103, temperature 98 F (36.7 C), temperature source Oral, resp. rate 20, height 5\' 3"  (1.6 m), weight 159.836 kg (352 lb 6 oz). Patient Vitals for the past 24 hrs:  BP Temp Temp src Pulse Resp Height Weight  10/17/11 0406 130/76 mmHg 97.9 F (36.6 C) Axillary - 18  - -  10/17/11 0106 135/75 mmHg 98 F (36.7 C) Oral 103  20  5\' 3"  (1.6 m) 159.836 kg (352 lb 6 oz)   GENERAL: Well-developed, well-nourished female in no acute distress.  HEENT: normocephalic, good dentition HEART: normal rate RESP: normal effort ABDOMEN: Soft, nontender, nondistended, gravid.  EXTREMITIES: Nontender, no edema NEURO: alert and oriented  Pelvic exam deferred   FHT:  Baseline 145 , moderate variability, accelerations present, no decels, one episode of tracing MHR occurred while pt changing positions in bed, otherwise Category I tracing  Contractions: None   Labs:  Pt had 24 hour urine on 10/13/11 with result 544  Results for orders placed during  the hospital encounter of 10/17/11 (from the past 24 hour(s))  URINALYSIS, ROUTINE W REFLEX MICROSCOPIC     Status: Abnormal   Collection Time   10/17/11  1:12 AM      Component Value Range   Color, Urine YELLOW  YELLOW    APPearance CLEAR  CLEAR    Specific Gravity, Urine 1.015  1.005 - 1.030    pH 6.0  5.0 - 8.0    Glucose, UA 500 (*) NEGATIVE (mg/dL)   Hgb urine dipstick TRACE (*) NEGATIVE    Bilirubin Urine NEGATIVE  NEGATIVE    Ketones, ur NEGATIVE  NEGATIVE (mg/dL)   Protein, ur NEGATIVE  NEGATIVE (mg/dL)   Urobilinogen, UA 0.2  0.0 - 1.0 (mg/dL)   Nitrite NEGATIVE  NEGATIVE    Leukocytes, UA NEGATIVE  NEGATIVE   PROTEIN / CREATININE RATIO, URINE     Status:  Abnormal   Collection Time   10/17/11  1:12 AM      Component Value Range   Creatinine, Urine 72.58     Total Protein, Urine 21     PROTEIN CREATININE RATIO 0.29 (*) 0.00 - 0.15   URINE MICROSCOPIC-ADD ON     Status: Abnormal   Collection Time   10/17/11  1:12 AM      Component Value Range   Squamous Epithelial / LPF FEW (*) RARE    RBC / HPF 0-2  <3 (RBC/hpf)  CBC     Status: Abnormal   Collection Time   10/17/11  2:40 AM      Component Value Range   WBC 8.5  4.0 - 10.5 (K/uL)   RBC 4.71  3.87 - 5.11 (MIL/uL)   Hemoglobin 10.7 (*) 12.0 - 15.0 (g/dL)   HCT 96.0 (*) 45.4 - 46.0 (%)   MCV 72.2 (*) 78.0 - 100.0 (fL)   MCH 22.7 (*) 26.0 - 34.0 (pg)   MCHC 31.5  30.0 - 36.0 (g/dL)   RDW 09.8 (*) 11.9 - 15.5 (%)   Platelets 91 (*) 150 - 400 (K/uL)  GLUCOSE, CAPILLARY     Status: Abnormal   Collection Time   10/17/11  3:39 AM      Component Value Range   Glucose-Capillary 117 (*) 70 - 99 (mg/dL)     Assessment: Acid reflux in pregnancy Diabetes complicating pregnancy Morbid Obesity Thrombocytopenia labs stable  Plan:  In MAU: Zofran ODT and GI cocktail given with complete resolution of pt symptoms  D/C home with preeclampsia precautions May discard 24 hour urine started but not completed at home P/C ratio pending, will call pt today with P/C ratio results Pt to keep appt Tuesday for U/S and NST Return to MAU as needed  LEFTWICH-KIRBY, Haydin Calandra 5/24/20133:22 AM  Discussed assessment and findings with Dr Penne Lash following pt d/c Clinic to call pt and have her keep original appointment today for labs and to start 24 hour urine to return to MAU tomorrow

## 2011-10-17 NOTE — MAU Note (Signed)
Pt was seen in MAU on Wednesday 10/15/2011, for upper abdominal pain. Pt was given 2 prescriptions

## 2011-10-21 ENCOUNTER — Ambulatory Visit (HOSPITAL_COMMUNITY): Admission: RE | Admit: 2011-10-21 | Payer: Medicaid Other | Source: Ambulatory Visit

## 2011-10-21 ENCOUNTER — Encounter (HOSPITAL_COMMUNITY): Payer: Self-pay | Admitting: *Deleted

## 2011-10-21 ENCOUNTER — Ambulatory Visit (INDEPENDENT_AMBULATORY_CARE_PROVIDER_SITE_OTHER): Payer: Medicaid Other | Admitting: *Deleted

## 2011-10-21 ENCOUNTER — Ambulatory Visit (HOSPITAL_COMMUNITY): Payer: Self-pay

## 2011-10-21 ENCOUNTER — Inpatient Hospital Stay (HOSPITAL_COMMUNITY)
Admission: AD | Admit: 2011-10-21 | Discharge: 2011-10-21 | Disposition: A | Discharge status: Home / Self Care | Payer: Medicaid Other | Source: Ambulatory Visit | Attending: Obstetrics and Gynecology | Admitting: Obstetrics and Gynecology

## 2011-10-21 ENCOUNTER — Ambulatory Visit (HOSPITAL_COMMUNITY)
Admission: RE | Admit: 2011-10-21 | Discharge: 2011-10-21 | Disposition: A | Discharge status: Home / Self Care | Payer: Medicaid Other | Source: Ambulatory Visit | Attending: Obstetrics and Gynecology | Admitting: Obstetrics and Gynecology

## 2011-10-21 VITALS — BP 128/71 | Wt 359.4 lb

## 2011-10-21 DIAGNOSIS — O169 Unspecified maternal hypertension, unspecified trimester: Secondary | ICD-10-CM

## 2011-10-21 DIAGNOSIS — O368390 Maternal care for abnormalities of the fetal heart rate or rhythm, unspecified trimester, not applicable or unspecified: Secondary | ICD-10-CM | POA: Insufficient documentation

## 2011-10-21 DIAGNOSIS — O24919 Unspecified diabetes mellitus in pregnancy, unspecified trimester: Secondary | ICD-10-CM

## 2011-10-21 DIAGNOSIS — IMO0002 Reserved for concepts with insufficient information to code with codable children: Secondary | ICD-10-CM

## 2011-10-21 DIAGNOSIS — I10 Essential (primary) hypertension: Secondary | ICD-10-CM

## 2011-10-21 LAB — COMPREHENSIVE METABOLIC PANEL
ALT: 18 U/L (ref 0–35)
AST: 21 U/L (ref 0–37)
Albumin: 2.6 g/dL — ABNORMAL LOW (ref 3.5–5.2)
Alkaline Phosphatase: 107 U/L (ref 39–117)
BUN: 5 mg/dL — ABNORMAL LOW (ref 6–23)
CO2: 24 mEq/L (ref 19–32)
Calcium: 9.6 mg/dL (ref 8.4–10.5)
GFR calc Af Amer: >90 mL/min (ref >90)
Glucose, Bld: 121 mg/dL — ABNORMAL HIGH (ref 70–99)
Sodium: 138 mEq/L (ref 135–145)
Total Bilirubin: 0.3 mg/dL (ref 0.3–1.2)
Total Protein: 5.8 g/dL — ABNORMAL LOW (ref 6.0–8.3)
Total Protein: 6.6 g/dL (ref 6.0–8.3)

## 2011-10-21 LAB — CBC
HCT: 36 % (ref 36.0–46.0)
Hemoglobin: 11.6 g/dL — ABNORMAL LOW (ref 12.0–15.0)
MCH: 22.9 pg — ABNORMAL LOW (ref 26.0–34.0)
MCH: 23.2 pg — ABNORMAL LOW (ref 26.0–34.0)
MCHC: 31.9 g/dL (ref 30.0–36.0)
MCHC: 32.2 g/dL (ref 30.0–36.0)
MCV: 71.7 fL — ABNORMAL LOW (ref 78.0–100.0)
Platelets: 98 10*3/uL — ABNORMAL LOW (ref 150–400)
RDW: 16.8 % — ABNORMAL HIGH (ref 11.5–15.5)
RDW: 16.4 % — ABNORMAL HIGH (ref 11.5–15.5)
WBC: 7.1 10*3/uL (ref 4.0–10.5)

## 2011-10-21 LAB — URINALYSIS, ROUTINE W REFLEX MICROSCOPIC
Ketones, ur: 15 mg/dL — AB
Leukocytes, UA: NEGATIVE
Nitrite: NEGATIVE
Specific Gravity, Urine: 1.01 (ref 1.005–1.030)
pH: 6 (ref 5.0–8.0)

## 2011-10-21 LAB — GLUCOSE, CAPILLARY: Glucose-Capillary: 125 mg/dL — ABNORMAL HIGH (ref 70–99)

## 2011-10-21 MED ORDER — CYCLOBENZAPRINE HCL 10 MG PO TABS
10.0000 mg | ORAL_TABLET | Freq: Once | ORAL | Status: AC
  Administered 2011-10-21: 10 mg via ORAL
  Filled 2011-10-21: qty 1

## 2011-10-21 NOTE — MAU Provider Note (Signed)
History     CSN: [id number]  Arrival date and time: 10/21/11 1253   None     Chief Complaint  Patient presents with  . Non-stress Test   HPI This is a 21 year old G1 P0 at 35 weeks and 2 days who sent to the MAU from the clinic due to nonreactive fetal heart tracing. The patient was monitored for 30 minutes the clinic with no accelerations seen. No decelerations seen with juice. The patient has no other complaints. She is have active fetal movement.  She complains of mild right-sided abdominal pain, but no nausea, vomiting, diarrhea, constipation. She has no contractions.   OB History    Grav Para Term Preterm Abortions TAB SAB Ect Mult Living   1 0 0 0 0 0 0 0 0 0       Past Medical History  Diagnosis Date  . Thyroid disease   . Hypertension   . Diabetes mellitus   . Hypothyroidism     Past Surgical History  Procedure Date  . Wisdom tooth extraction     Family History  Problem Relation Age of Onset  . Cancer Paternal Aunt   . Cancer Paternal Uncle   . Diabetes Paternal Grandmother   . Diabetes Paternal Grandfather   . Anesthesia problems Neg Hx     History  Substance Use Topics  . Smoking status: Never Smoker   . Smokeless tobacco: Never Used  . Alcohol Use: No    Allergies: No Known Allergies  Prescriptions prior to admission  Medication Sig Dispense Refill  . famotidine (PEPCID) 20 MG tablet Take 1 tablet (20 mg total) by mouth 2 (two) times daily.  30 tablet  2  . flintstones complete (FLINTSTONES) 60 MG chewable tablet Chew 1 tablet by mouth every morning.      . glyBURIDE (DIABETA) 2.5 MG tablet Take 2.5 mg by mouth at bedtime.      Marland Kitchen levothyroxine (SYNTHROID, LEVOTHROID) 50 MCG tablet Take 50 mcg by mouth daily.      . metFORMIN (GLUCOPHAGE) 500 MG tablet Take 500 mg by mouth 2 (two) times daily with a meal.      . oxyCODONE-acetaminophen (PERCOCET) 5-325 MG per tablet Take 2 tablets by mouth once.  20 tablet  0    Review of Systems  All other  systems reviewed and are negative.   Physical Exam   Blood pressure 139/77, pulse 94, resp. rate 18.  Physical Exam  Constitutional: She is oriented to person, place, and time. She appears well-developed and well-nourished.  GI: Soft. She exhibits no distension and no mass. There is no tenderness. There is no rebound and no guarding.  Musculoskeletal: Normal range of motion.  Neurological: She is alert and oriented to person, place, and time.  Skin: Skin is warm and dry.  Psychiatric: She has a normal mood and affect. Her behavior is normal. Judgment and thought content normal.   Results for orders placed during the hospital encounter of 10/21/11 (from the past 24 hour(s))  GLUCOSE, CAPILLARY     Status: Abnormal   Collection Time   10/21/11  1:35 PM      Component Value Range   Glucose-Capillary 125 (*) 70 - 99 (mg/dL)   Comment 1 Notify RN    CBC     Status: Abnormal   Collection Time   10/21/11  2:30 PM      Component Value Range   WBC 8.3  4.0 - 10.5 (K/uL)   RBC 4.99  3.87 - 5.11 (MIL/uL)   Hemoglobin 11.6 (*) 12.0 - 15.0 (g/dL)   HCT 16.1  09.6 - 04.5 (%)   MCV 72.1 (*) 78.0 - 100.0 (fL)   MCH 23.2 (*) 26.0 - 34.0 (pg)   MCHC 32.2  30.0 - 36.0 (g/dL)   RDW 40.9 (*) 81.1 - 15.5 (%)   Platelets 92 (*) 150 - 400 (K/uL)  COMPREHENSIVE METABOLIC PANEL     Status: Abnormal   Collection Time   10/21/11  2:30 PM      Component Value Range   Sodium 135  135 - 145 (mEq/L)   Potassium 3.8  3.5 - 5.1 (mEq/L)   Chloride 101  96 - 112 (mEq/L)   CO2 24  19 - 32 (mEq/L)   Glucose, Bld 121 (*) 70 - 99 (mg/dL)   BUN 5 (*) 6 - 23 (mg/dL)   Creatinine, Ser 9.14  0.50 - 1.10 (mg/dL)   Calcium 9.6  8.4 - 78.2 (mg/dL)   Total Protein 6.6  6.0 - 8.3 (g/dL)   Albumin 2.6 (*) 3.5 - 5.2 (g/dL)   AST 22  0 - 37 (U/L)   ALT 18  0 - 35 (U/L)   Alkaline Phosphatase 106  39 - 117 (U/L)   Total Bilirubin 0.2 (*) 0.3 - 1.2 (mg/dL)   GFR calc non Af Amer >90  >90 (mL/min)   GFR calc Af Amer >90   >90 (mL/min)  URINALYSIS, ROUTINE W REFLEX MICROSCOPIC     Status: Abnormal   Collection Time   10/21/11  3:25 PM      Component Value Range   Color, Urine YELLOW  YELLOW    APPearance CLEAR  CLEAR    Specific Gravity, Urine 1.010  1.005 - 1.030    pH 6.0  5.0 - 8.0    Glucose, UA NEGATIVE  NEGATIVE (mg/dL)   Hgb urine dipstick NEGATIVE  NEGATIVE    Bilirubin Urine NEGATIVE  NEGATIVE    Ketones, ur 15 (*) NEGATIVE (mg/dL)   Protein, ur NEGATIVE  NEGATIVE (mg/dL)   Urobilinogen, UA 0.2  0.0 - 1.0 (mg/dL)   Nitrite NEGATIVE  NEGATIVE    Leukocytes, UA NEGATIVE  NEGATIVE     MAU Course  Procedures NST: Category 1 tracing with baseline rate of 140s. No ulcerations seen. Moderate variability. No contractions seen. BPP: 8 out of 8  MDM  Assessment and Plan  #1 non-reactive NST  With BPP 8 out of 8, patient sent home. Patient delivered 24-hour urine in clinic today. Patient to followup on the 31st for an NST.  Sandra House 10/21/2011, 1:45 PM

## 2011-10-21 NOTE — Progress Notes (Signed)
P = 93   Dr. Jolayne Panther called to review FHR tracing.  Plan of care discussed w/pt and she voiced understanding.  Pt sent to [place] dept. for BPP, then will have extended fetal monitoring @ MAU to follow.

## 2011-10-21 NOTE — Discharge Instructions (Signed)

## 2011-10-22 LAB — CREATININE CLEARANCE, URINE, 24 HOUR
Creatinine Clearance: 390 mL/min — ABNORMAL HIGH (ref 75–115)
Creatinine, 24H Ur: 2806 mg/d — ABNORMAL HIGH (ref 700–1800)
Creatinine: 0.5 mg/dL (ref 0.50–1.10)

## 2011-10-22 NOTE — MAU Provider Note (Signed)
Medical Screening exam and patient care preformed by advanced practice provider.  Agree with the above management.  

## 2011-10-23 ENCOUNTER — Encounter (HOSPITAL_COMMUNITY): Payer: Self-pay | Admitting: Obstetrics

## 2011-10-23 ENCOUNTER — Inpatient Hospital Stay (HOSPITAL_COMMUNITY)
Admission: AD | Admit: 2011-10-23 | Discharge: 2011-10-28 | DRG: 765 | Disposition: A | Discharge status: Home / Self Care | Payer: Medicaid Other | Source: Ambulatory Visit | Attending: Family Medicine | Admitting: Family Medicine

## 2011-10-23 DIAGNOSIS — O99214 Obesity complicating childbirth: Secondary | ICD-10-CM | POA: Diagnosis present

## 2011-10-23 DIAGNOSIS — O1002 Pre-existing essential hypertension complicating childbirth: Secondary | ICD-10-CM | POA: Diagnosis present

## 2011-10-23 DIAGNOSIS — E669 Obesity, unspecified: Secondary | ICD-10-CM | POA: Diagnosis present

## 2011-10-23 DIAGNOSIS — IMO0002 Reserved for concepts with insufficient information to code with codable children: Secondary | ICD-10-CM

## 2011-10-23 DIAGNOSIS — O2432 Unspecified pre-existing diabetes mellitus in childbirth: Secondary | ICD-10-CM | POA: Diagnosis present

## 2011-10-23 DIAGNOSIS — E119 Type 2 diabetes mellitus without complications: Secondary | ICD-10-CM | POA: Diagnosis present

## 2011-10-23 DIAGNOSIS — E039 Hypothyroidism, unspecified: Secondary | ICD-10-CM | POA: Diagnosis present

## 2011-10-23 DIAGNOSIS — O4290 Premature rupture of membranes, unspecified as to length of time between rupture and onset of labor, unspecified weeks of gestation: Principal | ICD-10-CM | POA: Diagnosis present

## 2011-10-23 DIAGNOSIS — O99284 Endocrine, nutritional and metabolic diseases complicating childbirth: Secondary | ICD-10-CM | POA: Diagnosis present

## 2011-10-23 DIAGNOSIS — E079 Disorder of thyroid, unspecified: Secondary | ICD-10-CM | POA: Diagnosis present

## 2011-10-23 LAB — CBC
MCHC: 33.2 g/dL (ref 30.0–36.0)
Platelets: 92 10*3/uL — ABNORMAL LOW (ref 150–400)
RDW: 16.2 % — ABNORMAL HIGH (ref 11.5–15.5)

## 2011-10-23 LAB — COMPREHENSIVE METABOLIC PANEL
CO2: 23 mEq/L (ref 19–32)
Calcium: 9.3 mg/dL (ref 8.4–10.5)
Chloride: 102 mEq/L (ref 96–112)
Creatinine, Ser: 0.52 mg/dL (ref 0.50–1.10)
GFR calc Af Amer: >90 mL/min (ref >90)
GFR calc non Af Amer: >90 mL/min (ref >90)
Glucose, Bld: 102 mg/dL — ABNORMAL HIGH (ref 70–99)
Total Bilirubin: 0.2 mg/dL — ABNORMAL LOW (ref 0.3–1.2)

## 2011-10-23 LAB — GLUCOSE, CAPILLARY: Glucose-Capillary: 97 mg/dL (ref 70–99)

## 2011-10-23 MED ORDER — ONDANSETRON HCL 4 MG/2ML IJ SOLN: 4.0000 mg | Freq: Four times a day (QID) | INTRAMUSCULAR | Status: DC | PRN

## 2011-10-23 MED ORDER — FLEET ENEMA 7-19 GM/118ML RE ENEM: 1.0000 | ENEMA | RECTAL | Status: DC | PRN

## 2011-10-23 MED ORDER — OXYTOCIN BOLUS FROM INFUSION
500.0000 mL | Freq: Once | INTRAVENOUS | Status: DC
  Filled 2011-10-23: qty 500

## 2011-10-23 MED ORDER — IBUPROFEN 600 MG PO TABS: 600.0000 mg | ORAL_TABLET | Freq: Four times a day (QID) | ORAL | Status: DC | PRN

## 2011-10-23 MED ORDER — LIDOCAINE HCL (PF) 1 % IJ SOLN: 30.0000 mL | INTRAMUSCULAR | Status: DC | PRN

## 2011-10-23 MED ORDER — NALBUPHINE SYRINGE 5 MG/0.5 ML
5.0000 mg | INJECTION | INTRAMUSCULAR | Status: DC | PRN
  Administered 2011-10-24 (×2): 5 mg via INTRAVENOUS
  Administered 2011-10-24: 10 mg via INTRAVENOUS
  Administered 2011-10-24 (×2): 5 mg via INTRAVENOUS
  Administered 2011-10-24: 10 mg via INTRAVENOUS
  Administered 2011-10-24: 5 mg via INTRAVENOUS
  Filled 2011-10-23: qty 0.5
  Filled 2011-10-23: qty 1
  Filled 2011-10-23: qty 0.5
  Filled 2011-10-23: qty 1
  Filled 2011-10-23 (×2): qty 0.5
  Filled 2011-10-23: qty 1

## 2011-10-23 MED ORDER — OXYTOCIN 20 UNITS IN LACTATED RINGERS INFUSION - SIMPLE: 125.0000 mL/h | Freq: Once | INTRAVENOUS | Status: DC

## 2011-10-23 MED ORDER — METFORMIN HCL 500 MG PO TABS
500.0000 mg | ORAL_TABLET | Freq: Two times a day (BID) | ORAL | Status: DC
  Administered 2011-10-26 – 2011-10-28 (×5): 500 mg via ORAL
  Filled 2011-10-23 (×12): qty 1

## 2011-10-23 MED ORDER — LACTATED RINGERS IV SOLN
500.0000 mL | INTRAVENOUS | Status: DC | PRN
  Administered 2011-10-24 – 2011-10-25 (×4): 500 mL via INTRAVENOUS

## 2011-10-23 MED ORDER — OXYCODONE-ACETAMINOPHEN 5-325 MG PO TABS: 1.0000 | ORAL_TABLET | ORAL | Status: DC | PRN

## 2011-10-23 MED ORDER — TERBUTALINE SULFATE 1 MG/ML IJ SOLN: 0.2500 mg | Freq: Once | INTRAMUSCULAR | Status: AC | PRN

## 2011-10-23 MED ORDER — LACTATED RINGERS IV SOLN
INTRAVENOUS | Status: DC
  Administered 2011-10-23 – 2011-10-25 (×4): via INTRAVENOUS

## 2011-10-23 MED ORDER — ACETAMINOPHEN 325 MG PO TABS
650.0000 mg | ORAL_TABLET | ORAL | Status: DC | PRN
  Administered 2011-10-25: 650 mg via ORAL
  Filled 2011-10-23: qty 2

## 2011-10-23 MED ORDER — CITRIC ACID-SODIUM CITRATE 334-500 MG/5ML PO SOLN
30.0000 mL | ORAL | Status: DC | PRN
  Administered 2011-10-25 (×2): 30 mL via ORAL
  Filled 2011-10-23 (×2): qty 15

## 2011-10-23 MED ORDER — PENICILLIN G POTASSIUM 5000000 UNITS IJ SOLR
2.5000 10*6.[IU] | INTRAVENOUS | Status: DC
  Administered 2011-10-24 – 2011-10-25 (×11): 2.5 10*6.[IU] via INTRAVENOUS
  Filled 2011-10-23 (×14): qty 2.5

## 2011-10-23 MED ORDER — LEVOTHYROXINE SODIUM 50 MCG PO TABS
50.0000 ug | ORAL_TABLET | Freq: Every day | ORAL | Status: DC
  Administered 2011-10-24 – 2011-10-28 (×5): 50 ug via ORAL
  Filled 2011-10-23 (×7): qty 1

## 2011-10-23 MED ORDER — DEXTROSE 5 % IV SOLN
5.0000 10*6.[IU] | Freq: Once | INTRAVENOUS | Status: AC
  Administered 2011-10-23: 5 10*6.[IU] via INTRAVENOUS
  Filled 2011-10-23: qty 5

## 2011-10-23 MED ORDER — MISOPROSTOL 25 MCG QUARTER TABLET
25.0000 ug | ORAL_TABLET | ORAL | Status: DC | PRN
  Administered 2011-10-23 – 2011-10-24 (×2): 25 ug via VAGINAL
  Filled 2011-10-23 (×2): qty 0.25

## 2011-10-23 NOTE — MAU Note (Signed)
Pt states she feels like her water broke about 1730.

## 2011-10-23 NOTE — H&P (Signed)
Sandra House is a 21 y.o. female presenting for Rupture of Membranes . Maternal Medical History:  Reason for admission: Reason for admission: rupture of membranes.  Contractions: Frequency: rare.    Fetal activity: Perceived fetal activity is normal.   Last perceived fetal movement was within the past hour.    Prenatal complications: Hypertension and infection (yeast infection about 1 week ago).   No bleeding, cholelithiasis, HIV, IUGR, nephrolithiasis, oligohydramnios, placental abnormality, polyhydramnios, pre-eclampsia, preterm labor, substance abuse, thrombocytopenia or thrombophilia.   Prenatal Complications - Diabetes: type 2. Diabetes is managed by oral agent (monotherapy).      OB History    Grav Para Term Preterm Abortions TAB SAB Ect Mult Living   1 0 0 0 0 0 0 0 0 0      Past Medical History  Diagnosis Date  . Thyroid disease   . Hypertension   . Diabetes mellitus   . Hypothyroidism    Past Surgical History  Procedure Date  . Wisdom tooth extraction    Family History: family history includes Cancer in her paternal aunt and paternal uncle and Diabetes in her paternal grandfather and paternal grandmother.  There is no history of Anesthesia problems. Social History:  reports that she has never smoked. She has never used smokeless tobacco. She reports that she does not drink alcohol or use illicit drugs.  Review of Systems  Constitutional: Negative for fever, chills and malaise/fatigue.  HENT: Negative for hearing loss, ear pain, nosebleeds, congestion, sore throat, tinnitus and ear discharge.   Eyes: Negative for blurred vision, double vision, photophobia, pain, discharge and redness.  Respiratory: Positive for wheezing (Occasionally hears "wheezing" when she lies down.). Negative for cough, hemoptysis, sputum production and shortness of breath.   Cardiovascular: Negative for chest pain, palpitations and leg swelling.  Gastrointestinal: Positive for abdominal  pain (Very mild, comes and goes.  Started about when water broke.  Goes away completely when not there.  Lasts about 1-2 minutes, every 5 - 10 minute s apart.). Negative for heartburn, vomiting, diarrhea, constipation, blood in stool and melena.  Genitourinary: Negative for dysuria.  Musculoskeletal: Negative for myalgias.  Skin: Negative for itching and rash.  Neurological: Positive for headaches (Very mild.  Patient attributes to anxiety.). Negative for dizziness, tingling, sensory change, focal weakness, seizures, loss of consciousness and weakness.  Endo/Heme/Allergies: Negative for environmental allergies. Does not bruise/bleed easily.  Psychiatric/Behavioral: Negative for depression and substance abuse. The patient is not nervous/anxious.       Blood pressure 143/76, temperature 98.8 F (37.1 C), temperature source Oral, resp. rate 18, SpO2 99.00%. Maternal Exam:  Uterine Assessment: Contraction frequency is rare.   Abdomen: Fundal height is Unable to measure secondary to extreme obesity.   Estimated fetal weight is Unable to estimate secondary to extreme obesity.   Fetal presentation: vertex  Introitus: Normal vulva. Vulva is negative for condylomata, edema, lesion, piercings and varicosities.  Vagina is positive for vaginal discharge (Fluid).  Vagina is negative for condylomata, cysts, edema and ulcerations.  Ferning test: positive.  Nitrazine test: not done. Amniotic fluid character: clear.  Pelvis: adequate for delivery.   Cervix: Cervix evaluated by digital exam.     Fetal Exam Fetal Monitor Review: Baseline rate: 135-140.  Variability: moderate (6-25 bpm).   Pattern: accelerations present and no decelerations.    Fetal State Assessment: Category I - tracings are normal.    Fetal presentation vertex by ultrasound.  Unable to ascertain on cervical check or abdominal exam.  Physical  Exam  Constitutional: She is oriented to person, place, and time. She appears  well-developed and well-nourished. No distress.  HENT:  Head: Normocephalic and atraumatic.  Eyes: Conjunctivae and EOM are normal. Right eye exhibits no discharge. Left eye exhibits no discharge. No scleral icterus.  Neck: No tracheal deviation present.  Cardiovascular: Normal rate, regular rhythm and normal heart sounds.   Respiratory: Effort normal and breath sounds normal. No stridor.  GI: Soft. She exhibits no distension. There is no tenderness. There is no rebound and no guarding.       Exam limited by extreme obesity  Genitourinary: Vulva exhibits no lesion. Vaginal discharge (Fluid) found.       Unable to palpate cervix on my exam.  By RN exam, unable to evaluate presenting part.  50% effaced, 0 dilatation, soft, mid position.  Musculoskeletal: She exhibits no edema.  Neurological: She is alert and oriented to person, place, and time. She displays normal reflexes.  Skin: Skin is warm and dry. She is not diaphoretic.  Psychiatric: She has a normal mood and affect. Her behavior is normal. Judgment and thought content normal.    Prenatal labs: ABO, Rh: A/POS/-- (05/13 1610) Antibody: NEG (05/13 0927) Rubella: 12.1 (05/13 0927) RPR: NON REAC (05/13 0927)  HBsAg: NEGATIVE (05/13 0927)  HIV: NON REACTIVE (05/13 0927)  GBS:   Unknown  Assessment/Plan: This is a 21 year old G1P0 at [redacted]w[redacted]d with rupture of membranes.  She is GBS unknown.  Presenting part is vertex by ultrasound.   Clancy Gourd 10/23/2011, 7:24 PM   I have seen this patient and agree with the above resident's note with the following addition to the POC:   Cytotec Q4 hours vaginally  PCN for GBS unknown preterm Preeclampsia labs with P/C ratio pending  LEFTWICH-KIRBY, Arelis Neumeier Certified Nurse-Midwife

## 2011-10-24 ENCOUNTER — Inpatient Hospital Stay (HOSPITAL_COMMUNITY): Payer: Medicaid Other | Admitting: Anesthesiology

## 2011-10-24 ENCOUNTER — Other Ambulatory Visit: Payer: Self-pay

## 2011-10-24 ENCOUNTER — Encounter (HOSPITAL_COMMUNITY): Payer: Self-pay | Admitting: Anesthesiology

## 2011-10-24 LAB — GLUCOSE, CAPILLARY
Glucose-Capillary: 101 mg/dL — ABNORMAL HIGH (ref 70–99)
Glucose-Capillary: 101 mg/dL — ABNORMAL HIGH (ref 70–99)
Glucose-Capillary: 118 mg/dL — ABNORMAL HIGH (ref 70–99)
Glucose-Capillary: 112 mg/dL — ABNORMAL HIGH (ref 70–99)
Glucose-Capillary: 107 mg/dL — ABNORMAL HIGH (ref 70–99)
Glucose-Capillary: 100 mg/dL — ABNORMAL HIGH (ref 70–99)
Glucose-Capillary: 94 mg/dL (ref 70–99)
Glucose-Capillary: 96 mg/dL (ref 70–99)

## 2011-10-24 LAB — CBC
MCH: 23.2 pg — ABNORMAL LOW (ref 26.0–34.0)
MCV: 72 fL — ABNORMAL LOW (ref 78.0–100.0)
Platelets: 94 10*3/uL — ABNORMAL LOW (ref 150–400)
RBC: 5.08 MIL/uL (ref 3.87–5.11)
RDW: 16.1 % — ABNORMAL HIGH (ref 11.5–15.5)
WBC: 7.9 10*3/uL (ref 4.0–10.5)

## 2011-10-24 LAB — PLATELET FUNCTION ASSAY

## 2011-10-24 LAB — RPR: RPR Ser Ql: NONREACTIVE

## 2011-10-24 MED ORDER — EPHEDRINE 5 MG/ML INJ: 10.0000 mg | INTRAVENOUS | Status: DC | PRN

## 2011-10-24 MED ORDER — OXYTOCIN 20 UNITS IN LACTATED RINGERS INFUSION - SIMPLE
1.0000 m[IU]/min | INTRAVENOUS | Status: DC
  Administered 2011-10-24: 2 m[IU]/min via INTRAVENOUS
  Filled 2011-10-24: qty 1000

## 2011-10-24 MED ORDER — PHENYLEPHRINE 40 MCG/ML (10ML) SYRINGE FOR IV PUSH (FOR BLOOD PRESSURE SUPPORT)
80.0000 ug | PREFILLED_SYRINGE | INTRAVENOUS | Status: DC | PRN
  Filled 2011-10-24 (×2): qty 5

## 2011-10-24 MED ORDER — FENTANYL 2.5 MCG/ML BUPIVACAINE 1/10 % EPIDURAL INFUSION (WH - ANES)
14.0000 mL/h | INTRAMUSCULAR | Status: DC
  Administered 2011-10-24 – 2011-10-25 (×6): 14 mL/h via EPIDURAL
  Filled 2011-10-24 (×7): qty 60

## 2011-10-24 MED ORDER — PHENYLEPHRINE 40 MCG/ML (10ML) SYRINGE FOR IV PUSH (FOR BLOOD PRESSURE SUPPORT): 80.0000 ug | PREFILLED_SYRINGE | INTRAVENOUS | Status: DC | PRN

## 2011-10-24 MED ORDER — LIDOCAINE HCL (PF) 1 % IJ SOLN
INTRAMUSCULAR | Status: DC | PRN
  Administered 2011-10-24 (×3): 4 mL

## 2011-10-24 MED ORDER — EPHEDRINE 5 MG/ML INJ
10.0000 mg | INTRAVENOUS | Status: DC | PRN
  Filled 2011-10-24 (×2): qty 4

## 2011-10-24 MED ORDER — DIPHENHYDRAMINE HCL 50 MG/ML IJ SOLN: 12.5000 mg | INTRAMUSCULAR | Status: DC | PRN

## 2011-10-24 MED ORDER — TERBUTALINE SULFATE 1 MG/ML IJ SOLN: 0.2500 mg | Freq: Once | INTRAMUSCULAR | Status: AC | PRN

## 2011-10-24 MED ORDER — LACTATED RINGERS IV SOLN
500.0000 mL | Freq: Once | INTRAVENOUS | Status: AC
  Administered 2011-10-24: 500 mL via INTRAVENOUS

## 2011-10-24 NOTE — Progress Notes (Signed)
Subjective: Patient comfortable with epidural.  Objective: BP 147/84  Pulse 95  Temp(Src) 98 F (36.7 C) (Oral)  Resp 16  Ht 5\' 2"  (1.575 m)  Wt 157.852 kg (348 lb)  BMI 63.65 kg/m2  SpO2 99% I/O last 3 completed shifts: In: -  Out: 850 [Urine:850]    FHT:  FHR: 140s bpm, variability: moderate,  accelerations:  Abscent,  decelerations:  Absent UC:   irregular, every 2-6 minutes SVE:   Dilation: 4.5 Effacement (%): 60 Station: -3 Exam by:: Lilli Few, RN  Labs: Lab Results  Component Value Date   WBC 7.9 10/24/2011   HGB 11.8* 10/24/2011   HCT 36.6 10/24/2011   MCV 72.0* 10/24/2011   PLT 94* 10/24/2011    Assessment / Plan: FSE placed due to poor difficulty tracing fetus.  Continue titrating pitocin for adequate contractions.  Sandra House 10/24/2011, 10:58 PM

## 2011-10-24 NOTE — H&P (Signed)
Pt seen and examined.  Pt has late prenatal care and has been noncompliant during pregnancy.  She has a history of CHTN and had proteinuria on first 24 hour urine collection.  Today's Protein/creatinine ration is c/w the amount of protein in her 24 hour collection so today's BP elevations are most likely not pre eclampsia.  Complicating the picture is the pt's thrombocytopenia since seeking care at 32 weeks.  Plts have been stable in the 90s and low 100s since initiating care.  Pt has not kept CBG log well so unsure of how her glycemic control has been.  Will induce for PPROM.  Will watch carefully for signs of preeclampsia.

## 2011-10-24 NOTE — Anesthesia Preprocedure Evaluation (Addendum)
Anesthesia Evaluation  Patient identified by MRN, date of birth, ID band Patient awake    Reviewed: Allergy & Precautions, H&P , NPO status , Patient's Chart, lab work & pertinent test results, reviewed documented beta blocker date and time   History of Anesthesia Complications Negative for: history of anesthetic complications  Airway Mallampati: III TM Distance: >3 FB Neck ROM: full    Dental  (+) Teeth Intact   Pulmonary neg pulmonary ROS,  breath sounds clear to auscultation        Cardiovascular hypertension (CHTN), Rhythm:regular Rate:Normal     Neuro/Psych negative neurological ROS  negative psych ROS   GI/Hepatic negative GI ROS, Neg liver ROS,   Endo/Other  Diabetes mellitus-, Type 2, Oral Hypoglycemic AgentsMorbid obesity  Renal/GU negative Renal ROS     Musculoskeletal   Abdominal   Peds  Hematology  (+) Blood dyscrasia (thrombocytopenia - platelet 94), ,   Anesthesia Other Findings   Reproductive/Obstetrics (+) Pregnancy                          Anesthesia Physical Anesthesia Plan  ASA: III  Anesthesia Plan: Epidural   Post-op Pain Management:    Induction:   Airway Management Planned:   Additional Equipment:   Intra-op Plan:   Post-operative Plan:   Informed Consent: I have reviewed the patients History and Physical, chart, labs and discussed the procedure including the risks, benefits and alternatives for the proposed anesthesia with the patient or authorized representative who has indicated his/her understanding and acceptance.     Plan Discussed with:   Anesthesia Plan Comments:         Anesthesia Quick Evaluation

## 2011-10-24 NOTE — Anesthesia Procedure Notes (Signed)
Epidural Patient location during procedure: OB Start time: 10/24/2011 9:03 PM Reason for block: procedure for pain  Staffing Performed by: anesthesiologist   Preanesthetic Checklist Completed: patient identified, site marked, surgical consent, pre-op evaluation, timeout performed, IV checked, risks and benefits discussed and monitors and equipment checked  Epidural Patient position: sitting Prep: site prepped and draped and DuraPrep Patient monitoring: continuous pulse ox and blood pressure Approach: midline Injection technique: LOR air  Needle:  Needle type: Tuohy  Needle gauge: 17 G Needle length: 9 cm Catheter type: closed end flexible Catheter size: 19 Gauge Test dose: negative  Assessment Events: blood not aspirated, injection not painful, no injection resistance, negative IV test and no paresthesia  Additional Notes Discussed risk of headache, infection, bleeding, nerve injury and failed or incomplete block.  Patient voices understanding and wishes to proceed.  LOR at 10 cm, epid cath pulled to 15 cm at skin, retracted to 17 cm at skin when patient sat up.  Taped at 17 cm at skin.

## 2011-10-24 NOTE — Progress Notes (Signed)
  Subjective: Some pressure  Objective: BP 128/67  Pulse 90  Temp(Src) 98.7 F (37.1 C) (Oral)  Resp 22  Ht 5\' 2"  (1.575 m)  Wt 157.852 kg (348 lb)  BMI 63.65 kg/m2  SpO2 100% I/O last 3 completed shifts: In: -  Out: 850 [Urine:850]    FHT:  FHR: 120 bpm, variability: moderate,  accelerations:  Abscent,  decelerations:  Absent UC:   irregular, every 8 minutes SVE:   Dilation: 4 Effacement (%): 60 Station: -1 Exam by:: Marijean Heath, RN  Labs: Lab Results  Component Value Date   WBC 7.4 10/23/2011   HGB 11.5* 10/23/2011   HCT 34.6* 10/23/2011   MCV 71.3* 10/23/2011   PLT 92* 10/23/2011    Assessment / Plan: folley balloon d/c.  Start pitocin.  Category 1 tracing.  Kaveh Kissinger JEHIEL 10/24/2011, 5:16 PM

## 2011-10-25 ENCOUNTER — Inpatient Hospital Stay (HOSPITAL_COMMUNITY): Payer: Medicaid Other | Admitting: Anesthesiology

## 2011-10-25 ENCOUNTER — Encounter (HOSPITAL_COMMUNITY): Payer: Self-pay | Admitting: Anesthesiology

## 2011-10-25 ENCOUNTER — Encounter (HOSPITAL_COMMUNITY): Payer: Self-pay | Admitting: *Deleted

## 2011-10-25 ENCOUNTER — Encounter (HOSPITAL_COMMUNITY)
Admission: AD | Disposition: A | Discharge status: Home / Self Care | Payer: Self-pay | Source: Ambulatory Visit | Attending: Family Medicine

## 2011-10-25 DIAGNOSIS — O1002 Pre-existing essential hypertension complicating childbirth: Secondary | ICD-10-CM

## 2011-10-25 DIAGNOSIS — O4290 Premature rupture of membranes, unspecified as to length of time between rupture and onset of labor, unspecified weeks of gestation: Secondary | ICD-10-CM

## 2011-10-25 DIAGNOSIS — O2432 Unspecified pre-existing diabetes mellitus in childbirth: Secondary | ICD-10-CM

## 2011-10-25 LAB — CBC
HCT: 36.8 % (ref 36.0–46.0)
HCT: 36.2 % (ref 36.0–46.0)
Hemoglobin: 11.7 g/dL — ABNORMAL LOW (ref 12.0–15.0)
MCHC: 31.8 g/dL (ref 30.0–36.0)
MCHC: 31.8 g/dL (ref 30.0–36.0)
MCV: 71.3 fL — ABNORMAL LOW (ref 78.0–100.0)
RBC: 5.11 MIL/uL (ref 3.87–5.11)
RDW: 15.8 % — ABNORMAL HIGH (ref 11.5–15.5)

## 2011-10-25 LAB — GLUCOSE, CAPILLARY
Glucose-Capillary: 120 mg/dL — ABNORMAL HIGH (ref 70–99)
Glucose-Capillary: 113 mg/dL — ABNORMAL HIGH (ref 70–99)
Glucose-Capillary: 107 mg/dL — ABNORMAL HIGH (ref 70–99)
Glucose-Capillary: 118 mg/dL — ABNORMAL HIGH (ref 70–99)

## 2011-10-25 LAB — PREPARE RBC (CROSSMATCH)

## 2011-10-25 SURGERY — Surgical Case
Anesthesia: Epidural | Site: Abdomen | Wound class: Clean Contaminated

## 2011-10-25 MED ORDER — HYDROMORPHONE BOLUS VIA INFUSION
INTRAVENOUS | Status: DC | PRN
  Administered 2011-10-25 (×2): 0.5 mg via INTRAVENOUS

## 2011-10-25 MED ORDER — SIMETHICONE 80 MG PO CHEW
80.0000 mg | CHEWABLE_TABLET | Freq: Three times a day (TID) | ORAL | Status: DC
  Administered 2011-10-26 – 2011-10-28 (×10): 80 mg via ORAL

## 2011-10-25 MED ORDER — SODIUM CHLORIDE 0.9 % IV SOLN
1.0000 ug/kg/h | INTRAVENOUS | Status: DC | PRN
  Filled 2011-10-25: qty 2.5

## 2011-10-25 MED ORDER — MORPHINE SULFATE (PF) 0.5 MG/ML IJ SOLN
INTRAMUSCULAR | Status: DC | PRN
  Administered 2011-10-25: 4 mg via EPIDURAL

## 2011-10-25 MED ORDER — OXYTOCIN 10 UNIT/ML IJ SOLN
40.0000 [IU] | INTRAVENOUS | Status: DC | PRN
  Administered 2011-10-25: 40 [IU] via INTRAVENOUS

## 2011-10-25 MED ORDER — MEASLES, MUMPS & RUBELLA VAC ~~LOC~~ INJ
0.5000 mL | INJECTION | Freq: Once | SUBCUTANEOUS | Status: DC
  Filled 2011-10-25: qty 0.5

## 2011-10-25 MED ORDER — PROMETHAZINE HCL 25 MG/ML IJ SOLN: 6.2500 mg | INTRAMUSCULAR | Status: DC | PRN

## 2011-10-25 MED ORDER — BUPIVACAINE HCL (PF) 0.25 % IJ SOLN
INTRAMUSCULAR | Status: AC
  Filled 2011-10-25: qty 30

## 2011-10-25 MED ORDER — IBUPROFEN 600 MG PO TABS
600.0000 mg | ORAL_TABLET | Freq: Four times a day (QID) | ORAL | Status: DC
  Administered 2011-10-25 – 2011-10-28 (×12): 600 mg via ORAL
  Filled 2011-10-25 (×12): qty 1

## 2011-10-25 MED ORDER — MORPHINE SULFATE (PF) 0.5 MG/ML IJ SOLN
INTRAMUSCULAR | Status: DC | PRN
  Administered 2011-10-25: 1 mg via INTRAVENOUS

## 2011-10-25 MED ORDER — TETANUS-DIPHTH-ACELL PERTUSSIS 5-2.5-18.5 LF-MCG/0.5 IM SUSP
0.5000 mL | Freq: Once | INTRAMUSCULAR | Status: DC
  Filled 2011-10-25: qty 0.5

## 2011-10-25 MED ORDER — SODIUM BICARBONATE 8.4 % IV SOLN
INTRAVENOUS | Status: AC
  Filled 2011-10-25: qty 50

## 2011-10-25 MED ORDER — NALBUPHINE HCL 10 MG/ML IJ SOLN
5.0000 mg | INTRAMUSCULAR | Status: DC | PRN
Start: 2011-10-25 — End: 2011-10-25

## 2011-10-25 MED ORDER — LIDOCAINE-EPINEPHRINE (PF) 2 %-1:200000 IJ SOLN
INTRAMUSCULAR | Status: AC
Start: 2011-10-25 — End: 2011-10-25
  Filled 2011-10-25: qty 20

## 2011-10-25 MED ORDER — NALOXONE HCL 0.4 MG/ML IJ SOLN: 0.4000 mg | INTRAMUSCULAR | Status: DC | PRN

## 2011-10-25 MED ORDER — DIPHENHYDRAMINE HCL 25 MG PO CAPS: 25.0000 mg | ORAL_CAPSULE | ORAL | Status: DC | PRN

## 2011-10-25 MED ORDER — CEFAZOLIN SODIUM 1-5 GM-% IV SOLN
INTRAVENOUS | Status: DC | PRN
  Administered 2011-10-25: 2 g via INTRAVENOUS

## 2011-10-25 MED ORDER — HYDROMORPHONE HCL PF 1 MG/ML IJ SOLN
INTRAMUSCULAR | Status: AC
  Filled 2011-10-25: qty 1

## 2011-10-25 MED ORDER — DIPHENHYDRAMINE HCL 50 MG/ML IJ SOLN: 12.5000 mg | INTRAMUSCULAR | Status: DC | PRN

## 2011-10-25 MED ORDER — METOCLOPRAMIDE HCL 5 MG/ML IJ SOLN: 10.0000 mg | Freq: Three times a day (TID) | INTRAMUSCULAR | Status: DC | PRN

## 2011-10-25 MED ORDER — ACETAMINOPHEN 325 MG PO TABS: 325.0000 mg | ORAL_TABLET | ORAL | Status: DC | PRN

## 2011-10-25 MED ORDER — GLYCOPYRROLATE 0.2 MG/ML IJ SOLN
INTRAMUSCULAR | Status: DC | PRN
  Administered 2011-10-25: 0.6 mg via INTRAVENOUS

## 2011-10-25 MED ORDER — MEPERIDINE HCL 25 MG/ML IJ SOLN: 6.2500 mg | INTRAMUSCULAR | Status: DC | PRN

## 2011-10-25 MED ORDER — MIDAZOLAM HCL 2 MG/2ML IJ SOLN: 0.5000 mg | Freq: Once | INTRAMUSCULAR | Status: DC | PRN

## 2011-10-25 MED ORDER — SCOPOLAMINE 1 MG/3DAYS TD PT72
1.0000 | MEDICATED_PATCH | Freq: Once | TRANSDERMAL | Status: AC
  Administered 2011-10-25: 1.5 mg via TRANSDERMAL

## 2011-10-25 MED ORDER — SODIUM CHLORIDE 0.9 % IJ SOLN: 3.0000 mL | INTRAMUSCULAR | Status: DC | PRN

## 2011-10-25 MED ORDER — NALBUPHINE HCL 10 MG/ML IJ SOLN: 5.0000 mg | INTRAMUSCULAR | Status: DC | PRN

## 2011-10-25 MED ORDER — CEFAZOLIN SODIUM 1-5 GM-% IV SOLN
INTRAVENOUS | Status: AC
  Filled 2011-10-25: qty 100

## 2011-10-25 MED ORDER — GLYCOPYRROLATE 0.2 MG/ML IJ SOLN
0.6000 mg | Freq: Once | INTRAMUSCULAR | Status: DC
  Filled 2011-10-25 (×2): qty 3

## 2011-10-25 MED ORDER — DIBUCAINE 1 % RE OINT: 1.0000 "application " | TOPICAL_OINTMENT | RECTAL | Status: DC | PRN

## 2011-10-25 MED ORDER — SODIUM BICARBONATE 8.4 % IV SOLN
INTRAVENOUS | Status: DC | PRN
  Administered 2011-10-25 (×4): 5 mL via EPIDURAL

## 2011-10-25 MED ORDER — BUPIVACAINE HCL (PF) 0.25 % IJ SOLN
INTRAMUSCULAR | Status: DC | PRN
  Administered 2011-10-25: 30 mL

## 2011-10-25 MED ORDER — WITCH HAZEL-GLYCERIN EX PADS: 1.0000 "application " | MEDICATED_PAD | CUTANEOUS | Status: DC | PRN

## 2011-10-25 MED ORDER — MORPHINE SULFATE 0.5 MG/ML IJ SOLN
INTRAMUSCULAR | Status: AC
  Filled 2011-10-25: qty 10

## 2011-10-25 MED ORDER — ONDANSETRON HCL 4 MG/2ML IJ SOLN
INTRAMUSCULAR | Status: AC
  Filled 2011-10-25: qty 2

## 2011-10-25 MED ORDER — FENTANYL CITRATE 0.05 MG/ML IJ SOLN: 100.0000 ug | Freq: Once | INTRAMUSCULAR | Status: DC

## 2011-10-25 MED ORDER — OXYTOCIN 20 UNITS IN LACTATED RINGERS INFUSION - SIMPLE: 125.0000 mL/h | INTRAVENOUS | Status: AC

## 2011-10-25 MED ORDER — FENTANYL CITRATE 0.05 MG/ML IJ SOLN
25.0000 ug | INTRAMUSCULAR | Status: DC | PRN
  Administered 2011-10-25 (×2): 50 ug via INTRAVENOUS

## 2011-10-25 MED ORDER — DIPHENHYDRAMINE HCL 50 MG/ML IJ SOLN
25.0000 mg | INTRAMUSCULAR | Status: DC | PRN
  Administered 2011-10-26: 25 mg via INTRAMUSCULAR
  Filled 2011-10-25: qty 1

## 2011-10-25 MED ORDER — OXYCODONE-ACETAMINOPHEN 5-325 MG PO TABS
1.0000 | ORAL_TABLET | ORAL | Status: DC | PRN
  Administered 2011-10-25 – 2011-10-26 (×6): 1 via ORAL
  Administered 2011-10-27 – 2011-10-28 (×10): 2 via ORAL
  Filled 2011-10-25: qty 1
  Filled 2011-10-25: qty 2
  Filled 2011-10-25 (×2): qty 1
  Filled 2011-10-25 (×2): qty 2
  Filled 2011-10-25: qty 1
  Filled 2011-10-25: qty 2
  Filled 2011-10-25: qty 1
  Filled 2011-10-25: qty 2
  Filled 2011-10-25: qty 1
  Filled 2011-10-25 (×2): qty 2
  Filled 2011-10-25: qty 1
  Filled 2011-10-25: qty 2
  Filled 2011-10-25 (×2): qty 1

## 2011-10-25 MED ORDER — MENTHOL 3 MG MT LOZG: 1.0000 | LOZENGE | OROMUCOSAL | Status: DC | PRN

## 2011-10-25 MED ORDER — PRENATAL MULTIVITAMIN CH
1.0000 | ORAL_TABLET | Freq: Every day | ORAL | Status: DC
  Administered 2011-10-26 – 2011-10-28 (×3): 1 via ORAL
  Filled 2011-10-25 (×3): qty 1

## 2011-10-25 MED ORDER — FENTANYL CITRATE 0.05 MG/ML IJ SOLN
INTRAMUSCULAR | Status: DC | PRN
  Administered 2011-10-25: 100 ug via EPIDURAL

## 2011-10-25 MED ORDER — SCOPOLAMINE 1 MG/3DAYS TD PT72
MEDICATED_PATCH | TRANSDERMAL | Status: AC
  Filled 2011-10-25: qty 1

## 2011-10-25 MED ORDER — SODIUM CHLORIDE 0.9 % IR SOLN
Status: DC | PRN
  Administered 2011-10-25: 1

## 2011-10-25 MED ORDER — MEPERIDINE HCL 25 MG/ML IJ SOLN
INTRAMUSCULAR | Status: DC | PRN
  Administered 2011-10-25: 25 mg via INTRAVENOUS
  Administered 2011-10-25 (×2): 12.5 mg via INTRAVENOUS

## 2011-10-25 MED ORDER — FENTANYL CITRATE 0.05 MG/ML IJ SOLN
INTRAMUSCULAR | Status: AC
  Filled 2011-10-25: qty 2

## 2011-10-25 MED ORDER — ZOLPIDEM TARTRATE 5 MG PO TABS: 5.0000 mg | ORAL_TABLET | Freq: Every evening | ORAL | Status: DC | PRN

## 2011-10-25 MED ORDER — MAGNESIUM SULFATE 40 G IN LACTATED RINGERS - SIMPLE
2.0000 g/h | INTRAVENOUS | Status: AC
  Administered 2011-10-25: 2 g/h via INTRAVENOUS
  Filled 2011-10-25: qty 500

## 2011-10-25 MED ORDER — SIMETHICONE 80 MG PO CHEW: 80.0000 mg | CHEWABLE_TABLET | ORAL | Status: DC | PRN

## 2011-10-25 MED ORDER — BUPIVACAINE HCL (PF) 0.25 % IJ SOLN
INTRAMUSCULAR | Status: DC | PRN
  Administered 2011-10-25: 1.5 mL
  Administered 2011-10-25: 2.5 mL
  Administered 2011-10-25: 2 mL
  Administered 2011-10-25: 5 mL
  Administered 2011-10-25: 4 mL

## 2011-10-25 MED ORDER — LANOLIN HYDROUS EX OINT: 1.0000 "application " | TOPICAL_OINTMENT | CUTANEOUS | Status: DC | PRN

## 2011-10-25 MED ORDER — ONDANSETRON HCL 4 MG/2ML IJ SOLN: 4.0000 mg | Freq: Three times a day (TID) | INTRAMUSCULAR | Status: DC | PRN

## 2011-10-25 MED ORDER — LACTATED RINGERS IV SOLN
INTRAVENOUS | Status: DC | PRN
  Administered 2011-10-25 (×2): via INTRAVENOUS

## 2011-10-25 MED ORDER — DIPHENHYDRAMINE HCL 25 MG PO CAPS: 25.0000 mg | ORAL_CAPSULE | Freq: Four times a day (QID) | ORAL | Status: DC | PRN

## 2011-10-25 MED ORDER — ACETAMINOPHEN 10 MG/ML IV SOLN
1000.0000 mg | Freq: Four times a day (QID) | INTRAVENOUS | Status: AC | PRN
  Filled 2011-10-25: qty 100

## 2011-10-25 MED ORDER — GLYCOPYRROLATE 0.2 MG/ML IJ SOLN
INTRAMUSCULAR | Status: AC
  Filled 2011-10-25: qty 2

## 2011-10-25 MED ORDER — DEXTROSE IN LACTATED RINGERS 5 % IV SOLN
INTRAVENOUS | Status: DC
  Administered 2011-10-26 (×2): via INTRAVENOUS

## 2011-10-25 MED ORDER — MEPERIDINE HCL 25 MG/ML IJ SOLN
INTRAMUSCULAR | Status: AC
  Filled 2011-10-25: qty 1

## 2011-10-25 MED ORDER — OXYTOCIN 10 UNIT/ML IJ SOLN
INTRAMUSCULAR | Status: AC
  Filled 2011-10-25: qty 2

## 2011-10-25 MED ORDER — ONDANSETRON HCL 4 MG PO TABS: 4.0000 mg | ORAL_TABLET | ORAL | Status: DC | PRN

## 2011-10-25 MED ORDER — SENNOSIDES-DOCUSATE SODIUM 8.6-50 MG PO TABS
2.0000 | ORAL_TABLET | Freq: Every day | ORAL | Status: DC
  Administered 2011-10-27 (×2): 2 via ORAL

## 2011-10-25 MED ORDER — OXYTOCIN 10 UNIT/ML IJ SOLN
INTRAMUSCULAR | Status: AC
  Filled 2011-10-25: qty 4

## 2011-10-25 MED ORDER — ONDANSETRON HCL 4 MG/2ML IJ SOLN: 4.0000 mg | INTRAMUSCULAR | Status: DC | PRN

## 2011-10-25 MED ORDER — MAGNESIUM SULFATE BOLUS VIA INFUSION
4.0000 g | Freq: Once | INTRAVENOUS | Status: AC
  Administered 2011-10-25: 4 g via INTRAVENOUS
  Filled 2011-10-25: qty 500

## 2011-10-25 SURGICAL SUPPLY — 31 items
BENZOIN TINCTURE PRP APPL 2/3 (GAUZE/BANDAGES/DRESSINGS) ×2 IMPLANT
CHLORAPREP W/TINT 26ML (MISCELLANEOUS) ×2 IMPLANT
CLOTH BEACON ORANGE TIMEOUT ST (SAFETY) ×2 IMPLANT
CONTAINER PREFILL 10% NBF 15ML (MISCELLANEOUS) IMPLANT
DRAIN JACKSON PRT FLT 7MM (DRAIN) IMPLANT
DRESSING TELFA 8X3 (GAUZE/BANDAGES/DRESSINGS) ×2 IMPLANT
ELECT REM PT RETURN 9FT ADLT (ELECTROSURGICAL) ×2
ELECTRODE REM PT RTRN 9FT ADLT (ELECTROSURGICAL) ×1 IMPLANT
EVACUATOR SILICONE 100CC (DRAIN) IMPLANT
EXTRACTOR VACUUM M CUP 4 TUBE (SUCTIONS) IMPLANT
GAUZE SPONGE 4X4 12PLY STRL LF (GAUZE/BANDAGES/DRESSINGS) ×2 IMPLANT
GLOVE BIO SURGEON STRL SZ7 (GLOVE) ×2 IMPLANT
GLOVE BIOGEL PI IND STRL 7.0 (GLOVE) ×2 IMPLANT
GLOVE BIOGEL PI INDICATOR 7.0 (GLOVE) ×2
GOWN PREVENTION PLUS LG XLONG (DISPOSABLE) ×8 IMPLANT
KIT ABG SYR 3ML LUER SLIP (SYRINGE) ×2 IMPLANT
NEEDLE HYPO 25X5/8 SAFETYGLIDE (NEEDLE) ×2 IMPLANT
NS IRRIG 1000ML POUR BTL (IV SOLUTION) ×2 IMPLANT
PACK C SECTION WH (CUSTOM PROCEDURE TRAY) ×2 IMPLANT
PAD ABD 7.5X8 STRL (GAUZE/BANDAGES/DRESSINGS) ×2 IMPLANT
RTRCTR C-SECT PINK 25CM LRG (MISCELLANEOUS) ×2 IMPLANT
SLEEVE SCD COMPRESS KNEE MED (MISCELLANEOUS) IMPLANT
STAPLER VISISTAT 35W (STAPLE) IMPLANT
STRIP CLOSURE SKIN 1/2X4 (GAUZE/BANDAGES/DRESSINGS) ×2 IMPLANT
SUT PLAIN 2 0 XLH (SUTURE) ×2 IMPLANT
SUT VIC AB 0 CTX 36 (SUTURE) ×4
SUT VIC AB 0 CTX36XBRD ANBCTRL (SUTURE) ×4 IMPLANT
SUT VIC AB 4-0 KS 27 (SUTURE) ×2 IMPLANT
TOWEL OR 17X24 6PK STRL BLUE (TOWEL DISPOSABLE) ×4 IMPLANT
TRAY FOLEY CATH 14FR (SET/KITS/TRAYS/PACK) IMPLANT
WATER STERILE IRR 1000ML POUR (IV SOLUTION) ×2 IMPLANT

## 2011-10-25 NOTE — Progress Notes (Signed)
  Subjective: Having back pain  Objective: BP 141/84  Pulse 101  Temp(Src) 99.5 F (37.5 C) (Axillary)  Resp 18  Ht 5\' 2"  (1.575 m)  Wt 157.852 kg (348 lb)  BMI 63.65 kg/m2  SpO2 99% I/O last 3 completed shifts: In: -  Out: 850 [Urine:850]    FHT:  FHR: 130s bpm, variability: moderate,  accelerations:  Abscent,  decelerations:  Absent UC:   irregular, every 2-5 minutes SVE:   Dilation: 5 Effacement (%): 80 Station: -3 Exam by:: Dr. Adrian Blackwater  Labs: Lab Results  Component Value Date   WBC 7.9 10/24/2011   HGB 11.8* 10/24/2011   HCT 36.6 10/24/2011   MCV 72.0* 10/24/2011   PLT 94* 10/24/2011    Assessment / Plan: Moderate effacement change.  Will continue with pitocin titration.  Category 1 tracing.  Sandra House JEHIEL 10/25/2011, 2:24 AM

## 2011-10-25 NOTE — Progress Notes (Signed)
Have re-assessed her and there is no significant cervical change for >24 hours.  The patient has been counseled extensively about pros and cons of ending this IOL vs. Primary C-Section.  Risks and benefits include but are not limited to:  Bleeding, infxn, injury to surrounding structures, blood clots, death.  Risks of continuing C-Section include never getting her into labor and emergent C-section being needed with inadequate anesthesia.  Her platelets have dropped and the plan for continuous spinal has been dropped by anesthesia.  They will attempt to get level and we have discussed awake intubation if needed.   The patient agrees with the plan.  All questions were answered.

## 2011-10-25 NOTE — Transfer of Care (Signed)
Immediate Anesthesia Transfer of Care Note  Patient: Sandra House  Procedure(s) Performed: Procedure(s) (LRB): CESAREAN SECTION (N/A)  Patient Location: PACU  Anesthesia Type: Epidural  Level of Consciousness: awake, alert  and oriented  Airway & Oxygen Therapy: Patient Spontanous Breathing  Post-op Assessment: Report given to PACU RN and Post -op Vital signs reviewed and stable  Post vital signs: stable  Complications: No apparent anesthesia complications

## 2011-10-25 NOTE — Progress Notes (Signed)
  Subjective: Patient still with back pain.  Objective: BP 124/65  Pulse 91  Temp(Src) 98.7 F (37.1 C) (Axillary)  Resp 14  Ht 5\' 2"  (1.575 m)  Wt 157.852 kg (348 lb)  BMI 63.65 kg/m2  SpO2 100% I/O last 3 completed shifts: In: -  Out: 850 [Urine:850] Total I/O In: -  Out: 900 [Urine:900]  FHT:  FHR: 130s bpm, variability: moderate,  accelerations:  Abscent,  decelerations:  Present variables UC:   irregular, every 2-5 minutes SVE:   Dilation: 5 Effacement (%): 80 Station: -3 Exam by:: Dr. Adrian Blackwater  Labs: Lab Results  Component Value Date   WBC 7.9 10/24/2011   HGB 11.8* 10/24/2011   HCT 36.6 10/24/2011   MCV 72.0* 10/24/2011   PLT 94* 10/24/2011    Assessment / Plan: Category 2 tracing.  Will continue to monitor closely.  Continue pitocin.  Sandra House JEHIEL 10/25/2011, 5:59 AM

## 2011-10-25 NOTE — Progress Notes (Signed)
  Subjective: Pt reports back pain not relieved by epidural.    Objective: BP 130/61  Pulse 89  Temp(Src) 99.3 F (37.4 C) (Axillary)  Resp 20  Ht 5\' 2"  (1.575 m)  Wt 157.852 kg (348 lb)  BMI 63.65 kg/m2  SpO2 100% I/O last 3 completed shifts: In: -  Out: 1750 [Urine:1750] Total I/O In: -  Out: 1000 [Urine:1000] CBG (last 3)   Basename 10/25/11 0825 10/25/11 0607 10/25/11 0423  GLUCAP 111* 113* 111*     FHT: 140's, poor tracing after fetal scalp electrode cords replaced three times. UC:   Toco - irregular  SVE:   Dilation: 5 Effacement (%): 80 Station: -3 Exam by:: wmohammad,CNM  Labs: Lab Results  Component Value Date   WBC 7.9 10/24/2011   HGB 11.8* 10/24/2011   HCT 36.6 10/24/2011   MCV 72.0* 10/24/2011   PLT 94* 10/24/2011    Assessment / Plan: Inadequate Contractions Chronic Hypertension - stable Type II Diabetes - Good Control  Labor: Inadequate Contractions Preeclampsia:  labs stable Fetal Wellbeing:  Difficult to Assess - will attempt to replace FSE w/Dr. Shawnie Pons Pain Control:  Epidural I/D:  n/a Anticipated MOD:  NSVD  [facility] 10/25/2011, 10:55 AM

## 2011-10-25 NOTE — Anesthesia Preprocedure Evaluation (Signed)
Anesthesia Evaluation  Patient identified by MRN, date of birth, ID band Patient awake    Reviewed: Allergy & Precautions, H&P , NPO status , Patient's Chart, lab work & pertinent test results, reviewed documented beta blocker date and time   History of Anesthesia Complications Negative for: history of anesthetic complications  Airway Mallampati: III TM Distance: >3 FB Neck ROM: full    Dental  (+) Teeth Intact   Pulmonary neg pulmonary ROS,  breath sounds clear to auscultation        Cardiovascular hypertension (CHTN), Rhythm:regular Rate:Normal     Neuro/Psych negative neurological ROS  negative psych ROS   GI/Hepatic negative GI ROS, Neg liver ROS,   Endo/Other  Diabetes mellitus-, Type 2, Oral Hypoglycemic AgentsMorbid obesity  Renal/GU negative Renal ROS     Musculoskeletal   Abdominal   Peds  Hematology  (+) Blood dyscrasia (thrombocytopenia - platelet 94), ,   Anesthesia Other Findings   Reproductive/Obstetrics (+) Pregnancy                           Anesthesia Physical  Anesthesia Plan  ASA: III and Emergent  Anesthesia Plan: Epidural   Post-op Pain Management:    Induction:   Airway Management Planned:   Additional Equipment:   Intra-op Plan:   Post-operative Plan:   Informed Consent: I have reviewed the patients History and Physical, chart, labs and discussed the procedure including the risks, benefits and alternatives for the proposed anesthesia with the patient or authorized representative who has indicated his/her understanding and acceptance.     Plan Discussed with:   Anesthesia Plan Comments:         Anesthesia Quick Evaluation

## 2011-10-25 NOTE — Anesthesia Postprocedure Evaluation (Signed)
Anesthesia Post Note  Patient: Sandra House  Procedure(s) Performed: Procedure(s) (LRB): CESAREAN SECTION (N/A)  Anesthesia type: Epidural  Patient location: PACU  Post pain: Pain level controlled  Post assessment: Post-op Vital signs reviewed  Last Vitals:  Filed Vitals:   10/25/11 2100  BP:   Pulse:   Temp:   Resp: 24    Post vital signs: Reviewed  Level of consciousness: awake  Complications: No apparent anesthesia complications

## 2011-10-25 NOTE — OR Nursing (Signed)
Fundal Massage by DLWegner RN 

## 2011-10-25 NOTE — Progress Notes (Signed)
Asked to see pt. Because she wanted to discuss Cesarean delivery.  She was admitted with PPROM and induced with cytotec and foley balloon, followed by pitocin.  She has had little progress over last 24 hours. She is not in active labor and contractions are q 8-10 mins.  She has not been on Pitocin since 6 am.  Having difficulty tracing baby, secondary to FSE is not working. FHR is 150's with mildly decreased variability.  She seems to be leaning toward 6 more hours of trying to have a baby with pitocin, followed by abdominal delivery if no progress. She was counseled about risks and benefits of surgery.  She will discuss with her mother and support person.

## 2011-10-25 NOTE — Progress Notes (Signed)
   Subjective: Pt reports vaginal pressure.  Objective: BP 141/77  Pulse 99  Temp(Src) 98.1 F (36.7 C) (Oral)  Resp 18  Ht 5\' 2"  (1.575 m)  Wt 157.852 kg (348 lb)  BMI 63.65 kg/m2  SpO2 97% I/O last 3 completed shifts: In: -  Out: 1750 [Urine:1750] Total I/O In: -  Out: 1400 [Urine:1400]  FHT:  FHR: 140's bpm, variability: moderate,  accelerations:  Present,  decelerations:  Present intermittent decels after contractions with return to baseline. UC:   irregular, every 3-6 minutes; MVU 60 SVE:   Dilation: 6 Effacement (%): 90 Station: 0 (molding) Exam by:: [email or web address]  Labs: Lab Results  Component Value Date   WBC 7.9 10/24/2011   HGB 11.8* 10/24/2011   HCT 36.6 10/24/2011   MCV 72.0* 10/24/2011   PLT 94* 10/24/2011    Assessment / Plan: Inadequate Contractions  Labor: Inadequate Contractions Preeclampsia:  labs stable Fetal Wellbeing:  Category II Pain Control:  Epidural I/D:  n/a Anticipated MOD:  Goal - NSVD  Dr. Shawnie Pons informed regarding OB exam and fetal monitoring > continue with pitocin and will reassess at 1800.  [facility] 10/25/2011, 4:58 PM

## 2011-10-25 NOTE — Op Note (Signed)
Preoperative Diagnosis:  IUP @ [redacted]w[redacted]d, PPROM, DM, HTN, Hypothyroid, Arrest of Dilation, Obesity  Postoperative Diagnosis:  Same  Procedure: Primary low transverse cesarean section  Surgeon: Tinnie Gens, M.D.  Assistant: None  Findings: Viable female infant, APGAR (1 MIN): 3   APGAR (5 MINS): 7, vtertex presentation, LOP position, PH 7.17  Estimated blood loss: 1000 cc  Complications: None known  Specimens: Placenta to labor and delivery  Reason for procedure: Briefly, the patient is a 21 y.o. G1P0000 [redacted]w[redacted]d who presents for PPROM.  Poorly dated, limited care, DM, HTN with proteinuria and low platelets.  Had cytotec, foley balloon and pitocin x 2.5 days and never got passed 4-5 cm.  Pt. Elected for abdominal delivery after risks reviewed.  Procedure: Patient is a to the OR where spinal analgesia was administered. She was then placed in a supine position with left lateral tilt. She received 2 g of Ancef and SCDs were in place. A timeout was performed. She was prepped and draped in the usual sterile fashion. A Foley catheter was already present in the bladder. A knife was then used to make a Pfannenstiel incision. This incision was carried out to underlying fascia which was divided in the midline with the knife. The incision was extended laterally, sharply.  The fascia was dissected off the rectus superiorly bluntly laterally, and sharply in the midline. The rectus was divided in the midline.  The peritoneal cavity was entered bluntly.  Alexis retractor was placed inside the incision.  A knife was used to make a low transverse incision on the uterus. This incision was carried down to the amniotic cavity was entered. Fetus was in LOP position and was brought up out of the incision without difficulty. Cord was clamped x 2 and cut. Infant taken to waiting pediatrician.  Cord pH was obtained.  Cord blood was obtained. Placenta was delivered from the uterus.  Uterus was cleaned with dry lap pads. Uterine  incision closed with 0 Vicryl suture in a locked running fashion. A second layer of 0 Vicryl in an imbricating fashion was used to achieve hemostasis.  There was a vertical extension downward in the LUS on the left side that was bleeding and repaired in running fashion and with several figure of eights. Hemostasis was confirmed.  Alexis retractor was removed from the abdomen. Peritoneal closure was done with 0 Vicryl suture.  Fascia is closed with 0 Vicryl suture in a running fashion. Subcutaneous tissue infused with 30cc 0.25% Marcaine.  Subcutaneous closure was performed with 0 plain suture.  Skin closed using 3-0 Vicryl on a Keith needle.  Steri strips applied, followed by pressure dressing.  All instrument, needle and lap counts were correct x 2.  Patient was awake and taken to PACU stable.  Infant to NICU, stable.

## 2011-10-25 NOTE — Progress Notes (Signed)
Patient ID: Sandra House, female   DOB: 02/18/1991, 20 y.o.   MRN: [id number] Called due to FSE inoperable. FHR 145  Cervix no change, applied FSE but did not stay on. Second attempt successful. Continuous monitoring  Gracelynne Benedict 10/25/2011 7:21 AM

## 2011-10-26 ENCOUNTER — Encounter (HOSPITAL_COMMUNITY): Payer: Self-pay | Admitting: *Deleted

## 2011-10-26 LAB — GLUCOSE, CAPILLARY
Glucose-Capillary: 150 mg/dL — ABNORMAL HIGH (ref 70–99)
Glucose-Capillary: 134 mg/dL — ABNORMAL HIGH (ref 70–99)

## 2011-10-26 LAB — CBC
Platelets: 93 10*3/uL — ABNORMAL LOW (ref 150–400)
RBC: 4.55 MIL/uL (ref 3.87–5.11)
WBC: 9.6 10*3/uL (ref 4.0–10.5)

## 2011-10-26 LAB — MRSA PCR SCREENING: MRSA by PCR: NEGATIVE

## 2011-10-26 NOTE — Clinical Social Work Maternal (Signed)
Clinical Social Work Department PSYCHOSOCIAL ASSESSMENT - MATERNAL/CHILD 10/26/2011  Patient:  Ringold,NEFARTAREE S  Account Number:  [id number]  Admit Date:  10/23/2011  Childs Name:   Isaih Sauls    Clinical Social Worker:  Severn Goddard, LCSW   Date/Time:  10/26/2011 12:10 PM  Date Referred:  10/26/2011   Referral source  NICU     Referred reason  NICU   Other referral source:    I:  FAMILY / HOME ENVIRONMENT Child's legal guardian:  PARENT  Guardian - Name Guardian - Age Guardian - Address  Nefartaree Tantillo [address], [place], [place] [address]   Other household support members/support persons Name Relationship DOB  Charlene Enoch MOTHER    Other support:   Family and friends    II  PSYCHOSOCIAL DATA Information Source:  Patient Interview  Financial and Community Resources Employment:   N/A   Financial resources:  Medicaid If Medicaid - County:  [place] Other  Food Stamps  WIC   School / Grade:   Maternity Care Coordinator / Child Services Coordination / Early Interventions:  Cultural issues impacting care:   None per pt.    III  STRENGTHS Strengths  Adequate Resources  Home prepared for Child (including basic supplies)  Supportive family/friends  Understanding of illness   Strength comment:  Pt was able to express her needs.   IV  RISK FACTORS AND CURRENT PROBLEMS Current Problem:  None   Risk Factor & Current Problem Patient Issue Family Issue Risk Factor / Current Problem Comment   N N     V  SOCIAL WORK ASSESSMENT SW received referral due to pt having a baby in the NICU. Pt is in Adult ICU.  She was alert and oriented.  Pt's mother was also present, whom she lives with.  Mother appeared very attentive to pt's needs.  Per pt, FOB is not involved at this time.  Pt does not work outside of the home.  She has Medicaid and food stamps.  She has a WIC appointment on 10/30/11.  She said she needed a pediatrician. SW provided her with list.   Pt states she has supplies for the baby and an adequate support system.  Pt stated she is receiving sufficient education regarding status of baby while in the NICU.  SW to continue following pt/baby to provide ongoing support.      VI SOCIAL WORK PLAN Social Work Plan  Psychosocial Support/Ongoing Assessment of Needs   Type of pt/family education:   Provided pt with NICU/SW brochure   If child protective services report - county:   If child protective services report - date:   Information/referral to community resources comment:   Other social work plan:      

## 2011-10-26 NOTE — Anesthesia Postprocedure Evaluation (Signed)
  Anesthesia Post-op Note  Patient: Sandra House  Procedure(s) Performed: * No procedures listed *  Patient Location: PACU and A-ICU  Anesthesia Type: Epidural  Level of Consciousness: awake, alert  and oriented  Airway and Oxygen Therapy: Patient Spontanous Breathing  Post-op Pain: none  Post-op Assessment: Post-op Vital signs reviewed and Patient's Cardiovascular Status Stable  Post-op Vital Signs: Reviewed and stable  Complications: No apparent anesthesia complications

## 2011-10-26 NOTE — Progress Notes (Addendum)
5/28 NST reviewed and non-reactive. Patient sent to MAU for prolong monitoring and BPP

## 2011-10-26 NOTE — Progress Notes (Signed)
Subjective: Postpartum Day 1: Cesarean Delivery Patient reports incisional pain and tolerating PO.  On magnesium for presumed PIH, BP is well controlled.  No significant diuresis yet.   Objective: Vital signs in last 24 hours: Temp:  [98.1 F (36.7 C)-99.4 F (37.4 C)] 99.2 F (37.3 C) (06/02 0200) Pulse Rate:  [88-122] 108  (06/02 0500) Resp:  [14-24] 18  (06/02 0500) BP: (119-158)/(49-113) 137/76 mmHg (06/02 0500) SpO2:  [92 %-100 %] 100 % (06/02 0500) Weight:  [164.52 kg (362 lb 11.2 oz)] 164.52 kg (362 lb 11.2 oz) (06/01 2315)  Physical Exam:  General: alert, cooperative and appears stated age Lochia: appropriate Uterine Fundus: firm Incision: dressing clean and dry DVT Evaluation: No evidence of DVT seen on physical exam.   Basename 10/26/11 0515 10/25/11 2145  HGB 10.4* 11.5*  HCT 32.4* 36.2    Assessment/Plan: Status post Cesarean section. Doing well postoperatively.  Continue current care. Magnesium x 24 hours post delivery. D/C foley. Ambulate.  Adamarie Izzo S 10/26/2011, 7:44 AM

## 2011-10-27 ENCOUNTER — Encounter (HOSPITAL_COMMUNITY): Payer: Self-pay | Admitting: Obstetrics & Gynecology

## 2011-10-27 ENCOUNTER — Other Ambulatory Visit: Payer: Self-pay

## 2011-10-27 LAB — GLUCOSE, CAPILLARY: Glucose-Capillary: 119 mg/dL — ABNORMAL HIGH (ref 70–99)

## 2011-10-27 MED ORDER — HYDROCHLOROTHIAZIDE 25 MG PO TABS
25.0000 mg | ORAL_TABLET | Freq: Every day | ORAL | Status: DC
  Administered 2011-10-27 – 2011-10-28 (×2): 25 mg via ORAL
  Filled 2011-10-27 (×3): qty 1

## 2011-10-27 NOTE — Progress Notes (Addendum)
Subjective: Postpartum Day 1: Cesarean Delivery Patient reports incisional pain, tolerating PO and no problems voiding.    Objective: Vital signs in last 24 hours: Temp:  [97.9 F (36.6 C)-98.4 F (36.9 C)] 98.3 F (36.8 C) (06/03 0641) Pulse Rate:  [107-129] 113  (06/03 0641) Resp:  [18-22] 20  (06/03 0641) BP: (109-141)/(52-93) 140/84 mmHg (06/03 0641) SpO2:  [94 %-100 %] 98 % (06/03 0641) Weight:  [167.014 kg (368 lb 3.2 oz)] 167.014 kg (368 lb 3.2 oz) (06/02 0800)  Physical Exam:  General: alert, cooperative and no distress Lochia: appropriate Uterine Fundus: firm Incision: no significant drainage, dressing still on DVT Evaluation: No evidence of DVT seen on physical exam. Negative Homan's sign. No cords or calf tenderness. 1+ edema - unchanged.   Basename 10/26/11 0515 10/25/11 2145  HGB 10.4* 11.5*  HCT 32.4* 36.2    Assessment/Plan: Status post Cesarean section. Doing well postoperatively.  Continue current care. HCTZ for elevated bp  Sandra House 10/27/2011, 7:44 AM

## 2011-10-27 NOTE — Progress Notes (Signed)
I visited with pt while making rounds on the unit.  She seemed to be feeling some stress, understandably, but reported that she was fine and that she did not wish to visit further at this time.   Please page as needed, [phone]  Sandra House 10:44 AM   10/27/11 1000  Clinical Encounter Type  Visited With Patient  Visit Type Initial

## 2011-10-28 MED ORDER — OXYCODONE-ACETAMINOPHEN 5-325 MG PO TABS: 1.0000 | ORAL_TABLET | ORAL | Status: AC | PRN

## 2011-10-28 MED ORDER — HYDROCHLOROTHIAZIDE 25 MG PO TABS: 25.0000 mg | ORAL_TABLET | Freq: Every day | ORAL | Status: DC

## 2011-10-28 MED ORDER — LANOLIN HYDROUS EX OINT: 1.0000 "application " | TOPICAL_OINTMENT | CUTANEOUS | Status: DC | PRN

## 2011-10-28 MED ORDER — IBUPROFEN 600 MG PO TABS: 600.0000 mg | ORAL_TABLET | Freq: Four times a day (QID) | ORAL | Status: AC | PRN

## 2011-10-28 NOTE — Discharge Summary (Addendum)
Obstetric Discharge Summary Reason for Admission: Rupture of membranes Prenatal Procedures: none Intrapartum Procedures: cesarean: low cervical, transverse Postpartum Procedures: none Complications-Operative and Postpartum: none Hemoglobin  Date Value Range Status  10/26/2011 10.4* 12.0-15.0 (g/dL) Final     HCT  Date Value Range Status  10/26/2011 32.4* 36.0-46.0 (%) Final    Physical Exam:  General: alert, cooperative and morbidly obese Lochia: appropriate Uterine Fundus: Unable to palpate due to extreme obesity Incision: healing well DVT Evaluation: No evidence of DVT seen on physical exam.  Discharge Diagnoses: PPROM s/p delivery preterm  Discharge Information: Date: 10/28/2011 Activity: pelvic rest Diet: routine Medications: Ibuprofen and Percocet Condition: stable Instructions: refer to practice specific booklet Discharge to: home Follow-up Information    Please follow up. (Follow up with your primary care doctor for your diabetes and high blood pressure in 1-2 weeks.  Follow up with high risk clinic in 6 weeks.  Come to MAU if needed.)          Newborn Data: Live born female  Birth Weight: 5 lb 9.6 oz (2540 g) APGAR: 3, 7   CRANSTOUN, LANDI 10/28/2011, 9:21 AM  Preoperative Diagnosis: IUP @ [redacted]w[redacted]d, PPROM, DM, HTN, Hypothyroid, Arrest of Dilation, Obesity  Postoperative Diagnosis: Same  Procedure: Primary low transverse cesarean section  Surgeon: Tinnie Gens, M.D.  Assistant: None  Findings: Viable female infant, APGAR (1 MIN): 3  APGAR (5 MINS): 7, vtertex presentation, LOP position, PH 7.17  Estimated blood loss: 1000 cc  Complications: None known  Specimens: Placenta to labor and delivery  Reason for procedure: Briefly, the patient is a 21 y.o. G1P0000 [redacted]w[redacted]d  who presents for PPROM. Poorly dated, limited care, DM, HTN with proteinuria and low platelets. Had cytotec, foley balloon and pitocin x 2.5 days and never got passed 4-5 cm. Pt. Elected for abdominal  delivery after risks reviewed.  Procedure: Patient is a to the OR where spinal analgesia was administered. She was then placed in a supine position with left lateral tilt. She received 2 g of Ancef and SCDs were in place. A timeout was performed. She was prepped and draped in the usual sterile fashion. A Foley catheter was already present in the bladder. A knife was then used to make a Pfannenstiel incision. This incision was carried out to underlying fascia which was divided in the midline with the knife. The incision was extended laterally, sharply. The fascia was dissected off the rectus superiorly bluntly laterally, and sharply in the midline. The rectus was divided in the midline. The peritoneal cavity was entered bluntly. Alexis retractor was placed inside the incision. A knife was used to make a low transverse incision on the uterus. This incision was carried down to the amniotic cavity was entered. Fetus was in LOP position and was brought up out of the incision without difficulty. Cord was clamped x 2 and cut. Infant taken to waiting pediatrician. Cord pH was obtained. Cord blood was obtained. Placenta was delivered from the uterus. Uterus was cleaned with dry lap pads. Uterine incision closed with 0 Vicryl suture in a locked running fashion. A second layer of 0 Vicryl in an imbricating fashion was used to achieve hemostasis. There was a vertical extension downward in the LUS on the left side that was bleeding and repaired in running fashion and with several figure of eights. Hemostasis was confirmed. Alexis retractor was removed from the abdomen. Peritoneal closure was done with 0 Vicryl suture. Fascia is closed with 0 Vicryl suture in a running fashion.  Subcutaneous tissue infused with 30cc 0.25% Marcaine. Subcutaneous closure was performed with 0 plain suture. Skin closed using 3-0 Vicryl on a Keith needle. Steri strips applied, followed by pressure dressing. All instrument, needle and lap counts were  correct x 2. Patient was awake and taken to PACU stable. Infant to NICU, stable.

## 2011-10-28 NOTE — Progress Notes (Signed)
UR Chart review completed.  

## 2011-10-28 NOTE — Discharge Summary (Signed)
I examined pt and agree with documentation above and resident plan of care. Sandra House,Sandra House  

## 2011-10-29 LAB — TYPE AND SCREEN
ABO/RH(D): A POS
Antibody Screen: NEGATIVE
Unit division: 0

## 2011-11-26 ENCOUNTER — Ambulatory Visit: Payer: Medicaid Other | Admitting: Physician Assistant

## 2011-12-11 ENCOUNTER — Emergency Department (HOSPITAL_COMMUNITY)
Admission: EM | Admit: 2011-12-11 | Discharge: 2011-12-11 | Discharge status: Against Medical Advice | Payer: Medicaid Other | Attending: Emergency Medicine | Admitting: Emergency Medicine

## 2011-12-11 ENCOUNTER — Encounter (HOSPITAL_COMMUNITY): Payer: Self-pay | Admitting: Emergency Medicine

## 2011-12-11 DIAGNOSIS — R197 Diarrhea, unspecified: Secondary | ICD-10-CM | POA: Insufficient documentation

## 2011-12-11 DIAGNOSIS — E119 Type 2 diabetes mellitus without complications: Secondary | ICD-10-CM | POA: Insufficient documentation

## 2011-12-11 DIAGNOSIS — O9089 Other complications of the puerperium, not elsewhere classified: Secondary | ICD-10-CM | POA: Insufficient documentation

## 2011-12-11 DIAGNOSIS — E039 Hypothyroidism, unspecified: Secondary | ICD-10-CM | POA: Insufficient documentation

## 2011-12-11 DIAGNOSIS — E079 Disorder of thyroid, unspecified: Secondary | ICD-10-CM | POA: Insufficient documentation

## 2011-12-11 DIAGNOSIS — O165 Unspecified maternal hypertension, complicating the puerperium: Secondary | ICD-10-CM | POA: Insufficient documentation

## 2011-12-11 DIAGNOSIS — R109 Unspecified abdominal pain: Secondary | ICD-10-CM | POA: Insufficient documentation

## 2011-12-11 DIAGNOSIS — O2493 Unspecified diabetes mellitus in the puerperium: Secondary | ICD-10-CM | POA: Insufficient documentation

## 2011-12-11 LAB — CBC WITH DIFFERENTIAL/PLATELET
Basophils Absolute: 0 10*3/uL (ref 0.0–0.1)
Eosinophils Absolute: 0.2 10*3/uL (ref 0.0–0.7)
Eosinophils Relative: 2 % (ref 0–5)
HCT: 34.1 % — ABNORMAL LOW (ref 36.0–46.0)
Lymphocytes Relative: 35 % (ref 12–46)
Lymphs Abs: 3.5 10*3/uL (ref 0.7–4.0)
MCH: 23.1 pg — ABNORMAL LOW (ref 26.0–34.0)
MCV: 71.5 fL — ABNORMAL LOW (ref 78.0–100.0)
Monocytes Absolute: 0.6 10*3/uL (ref 0.1–1.0)
Platelets: 171 10*3/uL (ref 150–400)
RDW: 16.1 % — ABNORMAL HIGH (ref 11.5–15.5)
WBC: 9.9 10*3/uL (ref 4.0–10.5)

## 2011-12-11 LAB — BASIC METABOLIC PANEL
CO2: 28 mEq/L (ref 19–32)
Calcium: 10 mg/dL (ref 8.4–10.5)
Creatinine, Ser: 0.57 mg/dL (ref 0.50–1.10)
GFR calc non Af Amer: >90 mL/min (ref >90)
Glucose, Bld: 216 mg/dL — ABNORMAL HIGH (ref 70–99)

## 2011-12-11 NOTE — ED Provider Notes (Signed)
History     CSN: [id number]  Arrival date & time 12/11/11  1307   First MD Initiated Contact with Patient 12/11/11 1649      Chief Complaint  Patient presents with  . Abdominal Pain  . Diarrhea    (Consider location/radiation/quality/duration/timing/severity/associated sxs/prior treatment) HPI Comments: Pt not in room when went to evaluate. Per nursing pt left AMA.    Past Medical History  Diagnosis Date  . Thyroid disease   . Hypertension   . Diabetes mellitus   . Hypothyroidism     Past Surgical History  Procedure Date  . Wisdom tooth extraction   . Cesarean section 10/25/2011    Procedure: CESAREAN SECTION;  Surgeon: Lesly Dukes, MD;  Location: [facility];  Service: Gynecology;  Laterality: N/A;  Primary cesarean section of baby boy at 36 APGAR    Family History  Problem Relation Age of Onset  . Cancer Paternal Aunt   . Cancer Paternal Uncle   . Diabetes Paternal Grandmother   . Diabetes Paternal Grandfather   . Anesthesia problems Neg Hx     History  Substance Use Topics  . Smoking status: Never Smoker   . Smokeless tobacco: Never Used  . Alcohol Use: No    OB History    Grav Para Term Preterm Abortions TAB SAB Ect Mult Living   1 1 0 1 0 0 0 0 0 1       Review of Systems  Allergies  Review of patient's allergies indicates no known allergies.  Home Medications   Current Outpatient Rx  Name Route Sig Dispense Refill  . LEVOTHYROXINE SODIUM 50 MCG PO TABS Oral Take 50 mcg by mouth daily.    Marland Kitchen METFORMIN HCL 500 MG PO TABS Oral Take 500 mg by mouth 2 (two) times daily with a meal.      BP 144/67  Pulse 93  Temp 98.7 F (37.1 C) (Oral)  Resp 22  SpO2 100%  Breastfeeding? Unknown  Physical Exam  ED Course  Procedures (including critical care time)  Labs Reviewed  CBC WITH DIFFERENTIAL - Abnormal; Notable for the following:    Hemoglobin 11.0 (*)     HCT 34.1 (*)     MCV 71.5 (*)     MCH 23.1 (*)     RDW 16.1 (*)     All other  components within normal limits  BASIC METABOLIC PANEL - Abnormal; Notable for the following:    Glucose, Bld 216 (*)     All other components within normal limits  URINALYSIS, ROUTINE W REFLEX MICROSCOPIC   No results found.   No diagnosis found.  4:30 PM pt not in room  5:09 PM pt not in room   MDM  AMA        Jaci Carrel, PA-C 12/11/11 1714

## 2011-12-11 NOTE — ED Notes (Signed)
Pt c/o lower abd pain; pt sts 1 month post partum csection delivery; pt sts some diarrhea and feels pressure with urination

## 2011-12-13 NOTE — ED Provider Notes (Signed)
Medical screening examination/treatment/procedure(s) were performed by non-physician practitioner and as supervising physician I was immediately available for consultation/collaboration.  Kyrollos Cordell R. Afomia Blackley, MD 12/13/11 0703 

## 2011-12-29 ENCOUNTER — Ambulatory Visit: Payer: Medicaid Other | Admitting: Physician Assistant

## 2012-01-08 ENCOUNTER — Encounter (HOSPITAL_COMMUNITY): Payer: Self-pay | Admitting: *Deleted

## 2012-01-08 ENCOUNTER — Emergency Department (HOSPITAL_COMMUNITY)
Admission: EM | Admit: 2012-01-08 | Discharge: 2012-01-08 | Disposition: A | Discharge status: Home / Self Care | Payer: Medicaid Other | Source: Home / Self Care | Attending: Emergency Medicine | Admitting: Emergency Medicine

## 2012-01-08 DIAGNOSIS — N3000 Acute cystitis without hematuria: Secondary | ICD-10-CM

## 2012-01-08 DIAGNOSIS — E119 Type 2 diabetes mellitus without complications: Secondary | ICD-10-CM

## 2012-01-08 LAB — POCT URINALYSIS DIP (DEVICE)
Nitrite: POSITIVE — AB
Urobilinogen, UA: 1 mg/dL (ref 0.0–1.0)
pH: 6.5 (ref 5.0–8.0)

## 2012-01-08 LAB — POCT I-STAT, CHEM 8
Calcium, Ion: 1.18 mmol/L (ref 1.12–1.23)
Glucose, Bld: 236 mg/dL — ABNORMAL HIGH (ref 70–99)
HCT: 36 % (ref 36.0–46.0)
Hemoglobin: 12.2 g/dL (ref 12.0–15.0)
TCO2: 24 mmol/L (ref 0–100)

## 2012-01-08 MED ORDER — CEPHALEXIN 500 MG PO CAPS: 500.0000 mg | ORAL_CAPSULE | Freq: Three times a day (TID) | ORAL | Status: AC

## 2012-01-08 MED ORDER — PHENAZOPYRIDINE HCL 200 MG PO TABS: 200.0000 mg | ORAL_TABLET | Freq: Three times a day (TID) | ORAL | Status: AC | PRN

## 2012-01-08 MED ORDER — METFORMIN HCL 500 MG PO TABS: 500.0000 mg | ORAL_TABLET | Freq: Two times a day (BID) | ORAL | Status: DC

## 2012-01-08 NOTE — ED Notes (Signed)
Pt reports questionable pregnacy. Also states that she becomes sick after eating recently. Also states that she has not been taking medication for htn or diabetes.

## 2012-01-08 NOTE — ED Provider Notes (Signed)
Chief Complaint  Patient presents with  . Possible Pregnancy    History of Present Illness:   The patient is a 21 year old female with a one-week history of several episodes of emesis. She comes in today for a pregnancy test. She has a 59-month-old baby and she states she's been sexually active once since then. She had a menstrual period on July 25. The emesis occurred earlier in the week and over the past 2 days she's been able to keep food and liquids down well. She denies any abdominal pain or cramping. There was no blood in the emesis, coffee-ground emesis, or bilious emesis. She's felt chilled but not had a fever. She has had a few loose stools. No hematochezia or melena. Also for the last 2 days she's had some bladder pain, dysuria, frequency, and urgency. She denies any blood in the urine. No prior history of urinary tract infection. She feels tired and rundown doesn't have much energy. She has a history of type 2 diabetes since she was 27. She had been on metformin 500 mg twice a day but has not been taking this since she delivered her child. I am not certain as to why she decided to stop taking it. She states her weight has been stable and she's had no polyuria, polydipsia, blurry vision, or dry mouth. Her primary care physician is Dr. Viann Shove whom she plans to see next week.  Review of Systems:  Other than noted above, the patient denies any of the following symptoms. Systemic:  No fever, chills, sweats, fatigue, myalgias, headache, or anorexia. Eye:  No redness, pain or drainage. ENT:  No earache, nasal congestion, rhinorrhea, sinus pressure, or sore throat. Lungs:  No cough, sputum production, wheezing, shortness of breath.  Cardiovascular:  No chest pain, palpitations, or syncope. GI:  No nausea, vomiting, abdominal pain or diarrhea. GU:  No dysuria, frequency, or hematuria. Skin:  No rash or pruritis.  PMFSH:  Past medical history, family history, social history, meds, and  allergies were reviewed.   Physical Exam:   Vital signs:  BP 134/70  Pulse 96  Temp 99 F (37.2 C) (Oral)  Resp 20  SpO2 100%  LMP 12/24/2011 General:  Alert, in no distress. Eye:  PERRL, full EOMs.  Lids and conjunctivas were normal. ENT:  TMs and canals were normal, without erythema or inflammation.  Nasal mucosa was clear and uncongested, without drainage.  Mucous membranes were moist.  Pharynx was clear, without exudate or drainage.  There were no oral ulcerations or lesions. Neck:  Supple, no adenopathy, tenderness or mass. Thyroid was normal. Lungs:  No respiratory distress.  Lungs were clear to auscultation, without wheezes, rales or rhonchi.  Breath sounds were clear and equal bilaterally. Heart:  Regular rhythm, without gallops, murmers or rubs. Abdomen:  Soft, flat, and non-tender to palpation.  No hepatosplenomagaly or mass. Skin:  Clear, warm, and dry, without rash or lesions.  Labs:   Results for orders placed during the hospital encounter of 01/08/12  POCT URINALYSIS DIP (DEVICE)      Component Value Range   Glucose, UA 500 (*) NEGATIVE mg/dL   Bilirubin Urine NEGATIVE  NEGATIVE   Ketones, ur NEGATIVE  NEGATIVE mg/dL   Specific Gravity, Urine 1.025  1.005 - 1.030   Hgb urine dipstick LARGE (*) NEGATIVE   pH 6.5  5.0 - 8.0   Protein, ur 100 (*) NEGATIVE mg/dL   Urobilinogen, UA 1.0  0.0 - 1.0 mg/dL   Nitrite POSITIVE (*)  NEGATIVE   Leukocytes, UA MODERATE (*) NEGATIVE  POCT I-STAT, CHEM 8      Component Value Range   Sodium 141  135 - 145 mEq/L   Potassium 3.7  3.5 - 5.1 mEq/L   Chloride 102  96 - 112 mEq/L   BUN 9  6 - 23 mg/dL   Creatinine, Ser 1.47  0.50 - 1.10 mg/dL   Glucose, Bld 829 (*) 70 - 99 mg/dL   Calcium, Ion 5.62  1.30 - 1.23 mmol/L   TCO2 24  0 - 100 mmol/L   Hemoglobin 12.2  12.0 - 15.0 g/dL   HCT 86.5  78.4 - 69.6 %    Other Labs Obtained at Urgent Care Center:  A urine culture was obtained.  Results are pending at this time and we will call  about any positive results.  Assessment:  The primary encounter diagnosis was Diabetes mellitus, type 2. A diagnosis of Acute cystitis was also pertinent to this visit. Her diabetes is not well controlled, but not dangerously so. She also appears to have a urinary tract infection. It does not appear that she's pregnant. I spent a long time discussing the need for her to take her metformin, follow good diet, exercise, lose weight, check her blood sugars, and followup with her primary care physician.  Plan:   1.  The following meds were prescribed:   New Prescriptions   CEPHALEXIN (KEFLEX) 500 MG CAPSULE    Take 1 capsule (500 mg total) by mouth 3 (three) times daily.   METFORMIN (GLUCOPHAGE) 500 MG TABLET    Take 1 tablet (500 mg total) by mouth 2 (two) times daily with a meal.   PHENAZOPYRIDINE (PYRIDIUM) 200 MG TABLET    Take 1 tablet (200 mg total) by mouth 3 (three) times daily as needed for pain.   2.  The patient was instructed in symptomatic care and handouts were given. 3.  The patient was told to return if becoming worse in any way, if no better in 3 or 4 days, and given some red flag symptoms that would indicate earlier return.    Reuben Likes, MD 01/08/12 2037

## 2012-01-11 LAB — URINE CULTURE

## 2012-01-12 NOTE — ED Notes (Signed)
Urine culture: >100,000 colonies Klebsiella Pneumoniae.  Pt. Adequately treated with Keflex.   Sandra House 01/12/2012

## 2012-02-01 ENCOUNTER — Encounter (HOSPITAL_COMMUNITY): Payer: Self-pay | Admitting: Adult Health

## 2012-02-01 DIAGNOSIS — R071 Chest pain on breathing: Secondary | ICD-10-CM | POA: Insufficient documentation

## 2012-02-01 DIAGNOSIS — Z79899 Other long term (current) drug therapy: Secondary | ICD-10-CM | POA: Insufficient documentation

## 2012-02-01 DIAGNOSIS — E039 Hypothyroidism, unspecified: Secondary | ICD-10-CM | POA: Insufficient documentation

## 2012-02-01 DIAGNOSIS — M25519 Pain in unspecified shoulder: Secondary | ICD-10-CM | POA: Insufficient documentation

## 2012-02-01 DIAGNOSIS — R0602 Shortness of breath: Secondary | ICD-10-CM | POA: Insufficient documentation

## 2012-02-01 DIAGNOSIS — I10 Essential (primary) hypertension: Secondary | ICD-10-CM | POA: Insufficient documentation

## 2012-02-01 DIAGNOSIS — E119 Type 2 diabetes mellitus without complications: Secondary | ICD-10-CM | POA: Insufficient documentation

## 2012-02-01 NOTE — ED Notes (Signed)
C/o right shoulder pain for one week. Pain is worse with movement and feels like a "pulling pain and sharp" and described as constant  Pain was on both sides of shoulders and through chest but was relieved with use of biofreeze. Denies SOB, denies chest pain denies nausea.

## 2012-02-02 ENCOUNTER — Emergency Department (HOSPITAL_COMMUNITY)
Admit: 2012-02-02 | Discharge: 2012-02-02 | Disposition: A | Discharge status: Home / Self Care | Payer: Medicaid Other | Attending: Emergency Medicine | Admitting: Emergency Medicine

## 2012-02-02 ENCOUNTER — Emergency Department (HOSPITAL_COMMUNITY)
Admission: EM | Admit: 2012-02-02 | Discharge: 2012-02-02 | Disposition: A | Discharge status: Home / Self Care | Payer: Medicaid Other | Attending: Emergency Medicine | Admitting: Emergency Medicine

## 2012-02-02 ENCOUNTER — Emergency Department (HOSPITAL_COMMUNITY): Payer: Medicaid Other

## 2012-02-02 DIAGNOSIS — E039 Hypothyroidism, unspecified: Secondary | ICD-10-CM | POA: Insufficient documentation

## 2012-02-02 DIAGNOSIS — M25519 Pain in unspecified shoulder: Secondary | ICD-10-CM | POA: Insufficient documentation

## 2012-02-02 DIAGNOSIS — R0602 Shortness of breath: Secondary | ICD-10-CM | POA: Insufficient documentation

## 2012-02-02 DIAGNOSIS — E119 Type 2 diabetes mellitus without complications: Secondary | ICD-10-CM | POA: Insufficient documentation

## 2012-02-02 DIAGNOSIS — R0789 Other chest pain: Secondary | ICD-10-CM | POA: Insufficient documentation

## 2012-02-02 DIAGNOSIS — M542 Cervicalgia: Secondary | ICD-10-CM | POA: Insufficient documentation

## 2012-02-02 DIAGNOSIS — Z79899 Other long term (current) drug therapy: Secondary | ICD-10-CM | POA: Insufficient documentation

## 2012-02-02 DIAGNOSIS — R1011 Right upper quadrant pain: Secondary | ICD-10-CM | POA: Insufficient documentation

## 2012-02-02 DIAGNOSIS — R799 Abnormal finding of blood chemistry, unspecified: Secondary | ICD-10-CM | POA: Insufficient documentation

## 2012-02-02 DIAGNOSIS — I10 Essential (primary) hypertension: Secondary | ICD-10-CM | POA: Insufficient documentation

## 2012-02-02 LAB — BASIC METABOLIC PANEL
CO2: 24 mEq/L (ref 19–32)
Calcium: 10.1 mg/dL (ref 8.4–10.5)
Chloride: 100 mEq/L (ref 96–112)
Creatinine, Ser: 0.71 mg/dL (ref 0.50–1.10)
GFR calc Af Amer: >90 mL/min (ref >90)
Sodium: 137 mEq/L (ref 135–145)

## 2012-02-02 LAB — CBC WITH DIFFERENTIAL/PLATELET
Basophils Absolute: 0 10*3/uL (ref 0.0–0.1)
Basophils Relative: 0 % (ref 0–1)
HCT: 33.5 % — ABNORMAL LOW (ref 36.0–46.0)
Lymphocytes Relative: 44 % (ref 12–46)
MCHC: 32.2 g/dL (ref 30.0–36.0)
Monocytes Absolute: 0.7 10*3/uL (ref 0.1–1.0)
Neutro Abs: 5.6 10*3/uL (ref 1.7–7.7)
Platelets: 171 10*3/uL (ref 150–400)
RDW: 16.5 % — ABNORMAL HIGH (ref 11.5–15.5)
WBC: 11.4 10*3/uL — ABNORMAL HIGH (ref 4.0–10.5)

## 2012-02-02 LAB — D-DIMER, QUANTITATIVE: D-Dimer, Quant: 0.59 ug/mL-FEU — ABNORMAL HIGH (ref 0.00–0.48)

## 2012-02-02 LAB — POCT PREGNANCY, URINE: Preg Test, Ur: NEGATIVE

## 2012-02-02 MED ORDER — IBUPROFEN 600 MG PO TABS: 600.0000 mg | ORAL_TABLET | Freq: Four times a day (QID) | ORAL | Status: AC | PRN

## 2012-02-02 MED ORDER — MORPHINE SULFATE 4 MG/ML IJ SOLN
4.0000 mg | Freq: Once | INTRAMUSCULAR | Status: AC
  Administered 2012-02-02: 4 mg via INTRAVENOUS
  Filled 2012-02-02: qty 1

## 2012-02-02 MED ORDER — IOHEXOL 350 MG/ML SOLN
100.0000 mL | Freq: Once | INTRAVENOUS | Status: AC | PRN
  Administered 2012-02-02: 100 mL via INTRAVENOUS

## 2012-02-02 MED ORDER — HYDROCODONE-ACETAMINOPHEN 5-500 MG PO TABS: 1.0000 | ORAL_TABLET | Freq: Four times a day (QID) | ORAL | Status: AC | PRN

## 2012-02-02 MED ORDER — ONDANSETRON HCL 4 MG/2ML IJ SOLN
4.0000 mg | Freq: Once | INTRAMUSCULAR | Status: AC
  Administered 2012-02-02: 4 mg via INTRAVENOUS
  Filled 2012-02-02: qty 2

## 2012-02-02 MED ORDER — OXYCODONE-ACETAMINOPHEN 5-325 MG PO TABS
2.0000 | ORAL_TABLET | Freq: Once | ORAL | Status: AC
  Administered 2012-02-02: 2 via ORAL
  Filled 2012-02-02: qty 2

## 2012-02-02 MED ORDER — ONDANSETRON 4 MG PO TBDP
4.0000 mg | ORAL_TABLET | Freq: Once | ORAL | Status: AC
  Administered 2012-02-02: 4 mg via ORAL
  Filled 2012-02-02: qty 1

## 2012-02-02 NOTE — ED Provider Notes (Signed)
Pt with right shoulder pain for 1wk, radiating across chest. Pt signed out to me at shift change by PA green. Pt awaiting CT angio to RO PE.   8:13 AM Pt in NAD. CT angio negative for a PE, although study is limitted due to pt's body habitus. Pt's VS are normal. She is not hypoxic, not tachycardic.   Filed Vitals:   02/02/12 0656  BP: 134/75  Pulse: 92  Temp:   Resp: 18   Will d/c home with follow up.  Lottie Mussel, PA 02/02/12 [phone]

## 2012-02-02 NOTE — ED Provider Notes (Signed)
History     CSN: [id number]  Arrival date & time 02/01/12  2348   First MD Initiated Contact with Patient 02/02/12 [phone]      Chief Complaint  Patient presents with  . Shoulder Pain    (Consider location/radiation/quality/duration/timing/severity/associated sxs/prior treatment) HPI  She presents to the emergency department with complaints of right shoulder pain for one week. She states that over the past 3 days her pain has been radiating from the right side to the left side. She describes it as a pulling and sharp pain. The patient is not working nor in school. She denies doing any physical activity outside the norm. She admits that she is not active. She admits to having some mild shortness of breath and pain in her chest. She states that it is a 10 out of 10. She denies having cardiac history or history of blood clots. She is a diabetic and is controlled with metformin. She states that the pain is becoming even more severe that it concerned her therefore she came to the emergency department evaluated.  Past Medical History  Diagnosis Date  . Thyroid disease   . Hypertension   . Diabetes mellitus   . Hypothyroidism     Past Surgical History  Procedure Date  . Wisdom tooth extraction   . Cesarean section 10/25/2011    Procedure: CESAREAN SECTION;  Surgeon: Lesly Dukes, MD;  Location: [facility];  Service: Gynecology;  Laterality: N/A;  Primary cesarean section of baby boy at 18 APGAR    Family History  Problem Relation Age of Onset  . Cancer Paternal Aunt   . Cancer Paternal Uncle   . Diabetes Paternal Grandmother   . Diabetes Paternal Grandfather   . Anesthesia problems Neg Hx     History  Substance Use Topics  . Smoking status: Never Smoker   . Smokeless tobacco: Never Used  . Alcohol Use: No    OB History    Grav Para Term Preterm Abortions TAB SAB Ect Mult Living   1 1 0 1 0 0 0 0 0 1       Review of Systems   Review of Systems  Gen: no weight loss,  fevers, chills, night sweats  Eyes: no discharge or drainage, no occular pain or visual changes  Nose: no epistaxis or rhinorrhea  Mouth: no dental pain, no sore throat  Neck: no neck pain  Lungs:No wheezing, coughing or hemoptysis CV: + chest pain that is right sided, no palpitations, dependent edema or orthopnea  Abd: no abdominal pain, nausea, vomiting  GU: no dysuria or gross hematuria  MSK:  No abnormalities  Neuro: no headache, no focal neurologic deficits  Skin: no abnormalities Psyche: negative.    Allergies  Review of patient's allergies indicates no known allergies.  Home Medications   Current Outpatient Rx  Name Route Sig Dispense Refill  . ACETAMINOPHEN 500 MG PO TABS Oral Take 2,000 mg by mouth every 6 (six) hours as needed. For pain    . LEVOTHYROXINE SODIUM 50 MCG PO TABS Oral Take 50 mcg by mouth daily.    Marland Kitchen METFORMIN HCL 500 MG PO TABS Oral Take 500 mg by mouth 2 (two) times daily with a meal.      BP 151/80  Pulse 107  Temp 98.2 F (36.8 C) (Oral)  Resp 16  SpO2 98%  LMP 12/24/2011  Physical Exam  Nursing note and vitals reviewed. Constitutional: She appears well-developed and well-nourished. No distress.  HENT:  Head:  Normocephalic and atraumatic.  Eyes: Pupils are equal, round, and reactive to light.  Neck: Normal range of motion. Neck supple.  Cardiovascular: Normal rate and regular rhythm.   Pulmonary/Chest: Effort normal. No apnea. No respiratory distress. Chest wall is not dull to percussion. She exhibits tenderness (right shoulder). She exhibits no mass, no bony tenderness, no laceration, no crepitus, no edema, no deformity and no retraction.    Abdominal: Soft.  Neurological: She is alert.  Skin: Skin is warm and dry.    ED Course  Procedures (including critical care time)  Labs Reviewed  CBC WITH DIFFERENTIAL - Abnormal; Notable for the following:    WBC 11.4 (*)     Hemoglobin 10.8 (*)     HCT 33.5 (*)     MCV 71.6 (*)     MCH  23.1 (*)     RDW 16.5 (*)     Lymphs Abs 4.9 (*)     All other components within normal limits  BASIC METABOLIC PANEL - Abnormal; Notable for the following:    Glucose, Bld 295 (*)     All other components within normal limits  D-DIMER, QUANTITATIVE - Abnormal; Notable for the following:    D-Dimer, Quant 0.59 (*)     All other components within normal limits   Dg Chest 2 View  02/02/2012  *RADIOLOGY REPORT*  Clinical Data: Chest and right shoulder pain.  CHEST - 2 VIEW  Comparison: 07/02/2009.  Findings: Normal sized heart.  Clear lungs with normal vascularity. Normal appearing bones.  IMPRESSION: Normal examination, unchanged.   Original Report Authenticated By: Darrol Angel, M.D.      No diagnosis found.    MDM    Date: 02/02/2012  Rate: 96  Rhythm: normal sinus rhythm  QRS Axis: normal  Intervals: normal  ST/T Wave abnormalities: normal  Conduction Disutrbances:none  Narrative Interpretation:   Old EKG Reviewed: none available    On initial exam the pain appeared to be more musculoskeletal in nature. She does have some tenderness to palpation of the right upper quadrant. However the radiating from the right side to the left side and to the intensity of her pain concerns me for pulmonary embolism. I was unable to [facility] the patient's out to rule out a PE. Therefore d-dimer was ordered which has come back elevated. A CT angiography chest was ordered to rule out pulmonary embolism. If this is normal for patient's pain is due to musculoskeletal etiology. Negative troponin and normal EKG.   End of shift patient hand off to [place], PA-C. Pt awaiting urine preg and CT angio of the chest to r/o PE.   Dorthula Matas, PA 02/02/12 [phone]

## 2012-02-03 NOTE — ED Provider Notes (Signed)
Medical screening examination/treatment/procedure(s) were performed by non-physician practitioner and as supervising physician I was immediately available for consultation/collaboration.  Annalycia Done, MD 02/03/12 0230 

## 2012-02-03 NOTE — ED Provider Notes (Signed)
Medical screening examination/treatment/procedure(s) were performed by non-physician practitioner and as supervising physician I was immediately available for consultation/collaboration.   Sunnie Nielsen, MD 02/03/12 [phone]

## 2012-02-15 ENCOUNTER — Encounter (HOSPITAL_COMMUNITY): Payer: Self-pay | Admitting: *Deleted

## 2012-02-15 ENCOUNTER — Emergency Department (HOSPITAL_COMMUNITY)
Admission: EM | Admit: 2012-02-15 | Discharge: 2012-02-15 | Disposition: A | Discharge status: Home / Self Care | Payer: Medicaid Other | Attending: Emergency Medicine | Admitting: Emergency Medicine

## 2012-02-15 ENCOUNTER — Emergency Department (HOSPITAL_COMMUNITY): Payer: Medicaid Other

## 2012-02-15 DIAGNOSIS — E119 Type 2 diabetes mellitus without complications: Secondary | ICD-10-CM | POA: Insufficient documentation

## 2012-02-15 DIAGNOSIS — I10 Essential (primary) hypertension: Secondary | ICD-10-CM | POA: Insufficient documentation

## 2012-02-15 DIAGNOSIS — N390 Urinary tract infection, site not specified: Secondary | ICD-10-CM | POA: Insufficient documentation

## 2012-02-15 DIAGNOSIS — B9789 Other viral agents as the cause of diseases classified elsewhere: Secondary | ICD-10-CM | POA: Insufficient documentation

## 2012-02-15 DIAGNOSIS — B349 Viral infection, unspecified: Secondary | ICD-10-CM | POA: Diagnosis present

## 2012-02-15 LAB — CBC WITH DIFFERENTIAL/PLATELET
Basophils Absolute: 0 10*3/uL (ref 0.0–0.1)
Basophils Relative: 0 % (ref 0–1)
Eosinophils Absolute: 0 10*3/uL (ref 0.0–0.7)
HCT: 34.4 % — ABNORMAL LOW (ref 36.0–46.0)
Hemoglobin: 11.1 g/dL — ABNORMAL LOW (ref 12.0–15.0)
MCH: 23.1 pg — ABNORMAL LOW (ref 26.0–34.0)
MCHC: 32.3 g/dL (ref 30.0–36.0)
Monocytes Absolute: 1.1 10*3/uL — ABNORMAL HIGH (ref 0.1–1.0)
Neutro Abs: 8.3 10*3/uL — ABNORMAL HIGH (ref 1.7–7.7)
RDW: 15.7 % — ABNORMAL HIGH (ref 11.5–15.5)

## 2012-02-15 LAB — COMPREHENSIVE METABOLIC PANEL
Alkaline Phosphatase: 75 U/L (ref 39–117)
BUN: 7 mg/dL (ref 6–23)
Calcium: 10.1 mg/dL (ref 8.4–10.5)
Creatinine, Ser: 0.7 mg/dL (ref 0.50–1.10)
GFR calc Af Amer: >90 mL/min (ref >90)
Glucose, Bld: 263 mg/dL — ABNORMAL HIGH (ref 70–99)
Total Protein: 8.8 g/dL — ABNORMAL HIGH (ref 6.0–8.3)

## 2012-02-15 LAB — URINE MICROSCOPIC-ADD ON

## 2012-02-15 LAB — URINALYSIS, ROUTINE W REFLEX MICROSCOPIC
Ketones, ur: NEGATIVE mg/dL
Nitrite: POSITIVE — AB
Specific Gravity, Urine: 1.025 (ref 1.005–1.030)
pH: 5.5 (ref 5.0–8.0)

## 2012-02-15 LAB — GLUCOSE, CAPILLARY: Glucose-Capillary: 277 mg/dL — ABNORMAL HIGH (ref 70–99)

## 2012-02-15 LAB — PREGNANCY, URINE: Preg Test, Ur: NEGATIVE

## 2012-02-15 MED ORDER — KETOROLAC TROMETHAMINE 15 MG/ML IJ SOLN
15.0000 mg | Freq: Once | INTRAMUSCULAR | Status: AC
  Filled 2012-02-15: qty 1

## 2012-02-15 MED ORDER — DEXTROSE 5 % IV SOLN
1.0000 g | Freq: Once | INTRAVENOUS | Status: AC
  Administered 2012-02-15: 1 g via INTRAVENOUS
  Filled 2012-02-15: qty 10

## 2012-02-15 MED ORDER — CEPHALEXIN 500 MG PO CAPS: 1000.0000 mg | ORAL_CAPSULE | Freq: Two times a day (BID) | ORAL | Status: DC

## 2012-02-15 MED ORDER — ONDANSETRON HCL 4 MG/2ML IJ SOLN
4.0000 mg | Freq: Once | INTRAMUSCULAR | Status: AC
  Administered 2012-02-15: 4 mg via INTRAVENOUS
  Filled 2012-02-15: qty 2

## 2012-02-15 MED ORDER — KETOROLAC TROMETHAMINE 15 MG/ML IJ SOLN
15.0000 mg | Freq: Once | INTRAMUSCULAR | Status: AC
  Administered 2012-02-15: 15 mg via INTRAVENOUS
  Filled 2012-02-15: qty 1

## 2012-02-15 MED ORDER — ONDANSETRON 4 MG PO TBDP: ORAL_TABLET | ORAL | Status: DC

## 2012-02-15 MED ORDER — KETOROLAC TROMETHAMINE 30 MG/ML IJ SOLN
INTRAMUSCULAR | Status: AC
  Administered 2012-02-15: 15 mg
  Filled 2012-02-15: qty 1

## 2012-02-15 MED ORDER — SODIUM CHLORIDE 0.9 % IV BOLUS (SEPSIS)
1000.0000 mL | INTRAVENOUS | Status: AC
  Administered 2012-02-15: 1000 mL via INTRAVENOUS

## 2012-02-15 NOTE — ED Provider Notes (Addendum)
I saw and evaluated the patient, reviewed the resident's note and I agree with the findings and plan. Morbidly obese.  Has dm. Has fever and congestion for several days. Mom has same. + cough. No diarrhea or uti sxs. No distress. Tachycardia. Mild temp  Increase. Did not take apap or motrin before arrival.  ua suggests possible uti. Will tx with abxs.  No pneumonia. No dka.   Cheri Guppy, MD 02/15/12 2313  Cheri Guppy, MD 02/21/12 [phone]

## 2012-02-15 NOTE — ED Provider Notes (Signed)
History     CSN: [id number]  Arrival date & time 02/15/12  1617   First MD Initiated Contact with Patient 02/15/12 1856      Chief Complaint  Patient presents with  . Fever  . Emesis    (Consider location/radiation/quality/duration/timing/severity/associated sxs/prior treatment) Patient is a 21 y.o. female presenting with general illness. The history is provided by the patient.  Illness  The current episode started 2 days ago. The onset is undetermined. The problem occurs continuously. The problem has been unchanged. The problem is moderate. Nothing relieves the symptoms. Nothing aggravates the symptoms. Associated symptoms include a fever, diarrhea, nausea, vomiting, muscle aches and cough. Pertinent negatives include no abdominal pain, no congestion, no headaches, no neck pain and no eye pain. The cough has no precipitants. The cough is productive. Color change with cough: yellow. There is nasal congestion. The vomiting occurs intermittently. The emesis has an appearance of stomach contents. The vomiting is not associated with pain. The diarrhea occurs 2 to 4 times per day. The diarrhea is watery. She has been behaving normally.    Past Medical History  Diagnosis Date  . Thyroid disease   . Hypertension   . Diabetes mellitus   . Hypothyroidism     Past Surgical History  Procedure Date  . Wisdom tooth extraction   . Cesarean section 10/25/2011    Procedure: CESAREAN SECTION;  Surgeon: Lesly Dukes, MD;  Location: [facility];  Service: Gynecology;  Laterality: N/A;  Primary cesarean section of baby boy at 76 APGAR    Family History  Problem Relation Age of Onset  . Cancer Paternal Aunt   . Cancer Paternal Uncle   . Diabetes Paternal Grandmother   . Diabetes Paternal Grandfather   . Anesthesia problems Neg Hx     History  Substance Use Topics  . Smoking status: Never Smoker   . Smokeless tobacco: Never Used  . Alcohol Use: No    OB History    Grav Para Term Preterm  Abortions TAB SAB Ect Mult Living   1 1 0 1 0 0 0 0 0 1       Review of Systems  Constitutional: Positive for fever. Negative for fatigue.  HENT: Negative for congestion, drooling and neck pain.   Eyes: Negative for pain.  Respiratory: Positive for cough. Negative for shortness of breath.   Cardiovascular: Negative for chest pain.  Gastrointestinal: Positive for nausea, vomiting and diarrhea. Negative for abdominal pain.  Genitourinary: Negative for dysuria and hematuria.  Musculoskeletal: Positive for myalgias. Negative for back pain and gait problem.  Skin: Negative for color change.  Neurological: Negative for dizziness and headaches.  Hematological: Negative for adenopathy.  Psychiatric/Behavioral: Negative for behavioral problems.  All other systems reviewed and are negative.    Allergies  Review of patient's allergies indicates no known allergies.  Home Medications   Current Outpatient Rx  Name Route Sig Dispense Refill  . ACETAMINOPHEN 500 MG PO TABS Oral Take 1,500 mg by mouth every 6 (six) hours as needed. For pain    . LEVOTHYROXINE SODIUM 50 MCG PO TABS Oral Take 50 mcg by mouth daily.    Marland Kitchen METFORMIN HCL 500 MG PO TABS Oral Take 500 mg by mouth 2 (two) times daily with a meal.      BP 110/78  Pulse 115  Temp 99 F (37.2 C) (Oral)  Resp 18  SpO2 100%  LMP 01/23/2012  Physical Exam  Nursing note and vitals reviewed. Constitutional: She is  oriented to person, place, and time. She appears well-developed and well-nourished.  HENT:  Head: Normocephalic.  Mouth/Throat: Oropharynx is clear and moist. No oropharyngeal exudate.       Mild swelling of tonsils posteriorly.   Eyes: Conjunctivae normal and EOM are normal. Pupils are equal, round, and reactive to light.  Neck: Normal range of motion. Neck supple.  Cardiovascular: Normal heart sounds and intact distal pulses.  Exam reveals no gallop and no friction rub.   No murmur heard.      Sinus tachycardia    Pulmonary/Chest: Effort normal and breath sounds normal. No respiratory distress. She has no wheezes. She exhibits no tenderness.  Abdominal: Soft. Bowel sounds are normal. There is no tenderness. There is no rebound and no guarding.  Musculoskeletal: Normal range of motion. She exhibits no edema and no tenderness.  Neurological: She is alert and oriented to person, place, and time.  Skin: Skin is warm and dry.  Psychiatric: She has a normal mood and affect. Her behavior is normal.    ED Course  Procedures (including critical care time)  Labs Reviewed  GLUCOSE, CAPILLARY - Abnormal; Notable for the following:    Glucose-Capillary 320 (*)     All other components within normal limits  GLUCOSE, CAPILLARY - Abnormal; Notable for the following:    Glucose-Capillary 268 (*)     All other components within normal limits  CBC WITH DIFFERENTIAL  COMPREHENSIVE METABOLIC PANEL  URINALYSIS, ROUTINE W REFLEX MICROSCOPIC  PREGNANCY, URINE   No results found.   No diagnosis found.    MDM  7:11 PM 21 y.o. female w hx of N/V/D/cough for 2 days. Pt notes fever yesterday at home. Pt on metformin for DM, notes BS has been elevated in 200's for 2 days. Pt AFVSS here, mildly tachycardic and BS elevated at 268. Suspect viral syndrome as pt has had sick contacts. Will give IVF, toradol for body aches, zofran for nausea. Basic labs to check renal function while on metformin.     Pt found to have UTI, she is tolerating po, but will tx w/ IV rocephin in setting of systemic sx. Will plan for d/c home on keflex for uti after abx. I have discussed the diagnosis/risks/treatment options with the patient and believe the pt to be eligible for discharge home to follow-up with pcp in 1-2 days if no better. We also discussed returning to the ED immediately if new or worsening sx occur. We discussed the sx which are most concerning (e.g., worsening pain, inability to tolerate abx by mouth) that necessitate immediate  return. Any new prescriptions provided to the patient are listed below.  New Prescriptions   CEPHALEXIN (KEFLEX) 500 MG CAPSULE    Take 2 capsules (1,000 mg total) by mouth 2 (two) times daily.   ONDANSETRON (ZOFRAN ODT) 4 MG DISINTEGRATING TABLET    4mg  ODT q4 hours prn nausea/vomit   Clinical Impression 1. UTI (lower urinary tract infection)   2. Viral syndrome         Purvis Sheffield, MD 02/16/12 1420

## 2012-02-15 NOTE — ED Notes (Signed)
To ED for eval of fever, congestion, and vomiting for the past 3 days. States her mom and son where also recently sick with same.

## 2012-02-21 NOTE — ED Provider Notes (Signed)
I saw and evaluated the patient, reviewed the resident's note and I agree with the findings and plan.  Cheri Guppy, MD 02/21/12 [phone]

## 2012-08-17 ENCOUNTER — Emergency Department (HOSPITAL_COMMUNITY)
Admission: EM | Admit: 2012-08-17 | Discharge: 2012-08-17 | Disposition: A | Discharge status: Home / Self Care | Payer: Medicaid Other | Source: Home / Self Care

## 2012-08-17 ENCOUNTER — Encounter (HOSPITAL_COMMUNITY): Payer: Self-pay | Admitting: Emergency Medicine

## 2012-08-17 DIAGNOSIS — H6690 Otitis media, unspecified, unspecified ear: Secondary | ICD-10-CM

## 2012-08-17 DIAGNOSIS — R109 Unspecified abdominal pain: Secondary | ICD-10-CM

## 2012-08-17 DIAGNOSIS — H6691 Otitis media, unspecified, right ear: Secondary | ICD-10-CM

## 2012-08-17 DIAGNOSIS — M545 Low back pain: Secondary | ICD-10-CM

## 2012-08-17 DIAGNOSIS — E119 Type 2 diabetes mellitus without complications: Secondary | ICD-10-CM

## 2012-08-17 DIAGNOSIS — R7309 Other abnormal glucose: Secondary | ICD-10-CM

## 2012-08-17 DIAGNOSIS — H60399 Other infective otitis externa, unspecified ear: Secondary | ICD-10-CM

## 2012-08-17 DIAGNOSIS — M7918 Myalgia, other site: Secondary | ICD-10-CM

## 2012-08-17 DIAGNOSIS — H6091 Unspecified otitis externa, right ear: Secondary | ICD-10-CM

## 2012-08-17 DIAGNOSIS — R739 Hyperglycemia, unspecified: Secondary | ICD-10-CM

## 2012-08-17 MED ORDER — NEOMYCIN-POLYMYXIN-HC 3.5-10000-1 OT SUSP: 4.0000 [drp] | Freq: Three times a day (TID) | OTIC | Status: DC

## 2012-08-17 MED ORDER — AMOXICILLIN 500 MG PO CAPS: 1000.0000 mg | ORAL_CAPSULE | Freq: Two times a day (BID) | ORAL | Status: DC

## 2012-08-17 MED ORDER — NAPROXEN 375 MG PO TABS: 375.0000 mg | ORAL_TABLET | Freq: Two times a day (BID) | ORAL | Status: DC

## 2012-08-17 NOTE — ED Provider Notes (Signed)
History     CSN: [id number]  Arrival date & time 08/17/12  1832   None     Chief Complaint  Patient presents with  . Otalgia    right ear pain x 2 wks  . Abdominal Pain    x 2 wks  . Back Pain    lower back pain x 2 days    (Consider location/radiation/quality/duration/timing/severity/associated sxs/prior treatment) HPI Comments: 22 year old severely obese female presents with right earache for 2 weeks, abdominal pain intermittently for 2 weeks and low back pain for 2 days. She has nausea but no vomiting, diarrhea. She denies urinary symptoms or vaginal discharge. The abdominal pain is across the midabdomen and in the lower most bilateral pelvis. It started out as occasional but has been more frequent in the last few days. She has a decreased appetite. Denies fever or chills. Complains of pain across the low back. The pain is worse with prolonged sitting, various positions and bending.   Past Medical History  Diagnosis Date  . Thyroid disease   . Hypertension   . Diabetes mellitus   . Hypothyroidism     Past Surgical History  Procedure Laterality Date  . Wisdom tooth extraction    . Cesarean section  10/25/2011    Procedure: CESAREAN SECTION;  Surgeon: Lesly Dukes, MD;  Location: [facility];  Service: Gynecology;  Laterality: N/A;  Primary cesarean section of baby boy at 81 APGAR    Family History  Problem Relation Age of Onset  . Cancer Paternal Aunt   . Cancer Paternal Uncle   . Diabetes Paternal Grandmother   . Diabetes Paternal Grandfather   . Anesthesia problems Neg Hx     History  Substance Use Topics  . Smoking status: Never Smoker   . Smokeless tobacco: Never Used  . Alcohol Use: No    OB History   Grav Para Term Preterm Abortions TAB SAB Ect Mult Living   1 1 0 1 0 0 0 0 0 1       Review of Systems  Constitutional: Negative.   HENT: Positive for ear pain and ear discharge. Negative for hearing loss, congestion, sore throat, rhinorrhea and  postnasal drip.   Respiratory: Negative.   Cardiovascular: Negative.   Gastrointestinal: Positive for nausea and abdominal pain. Negative for vomiting, diarrhea, blood in stool and abdominal distention.  Genitourinary: Negative.   Skin: Negative.   Neurological: Negative.   Hematological: Negative.   Psychiatric/Behavioral: Negative.     Allergies  Review of patient's allergies indicates no known allergies.  Home Medications   Current Outpatient Rx  Name  Route  Sig  Dispense  Refill  . metFORMIN (GLUCOPHAGE) 500 MG tablet   Oral   Take 500 mg by mouth 2 (two) times daily with a meal.         . acetaminophen (TYLENOL) 500 MG tablet   Oral   Take 1,500 mg by mouth every 6 (six) hours as needed. For pain         . amoxicillin (AMOXIL) 500 MG capsule   Oral   Take 2 capsules (1,000 mg total) by mouth 2 (two) times daily.   40 capsule   0   . cephALEXin (KEFLEX) 500 MG capsule   Oral   Take 2 capsules (1,000 mg total) by mouth 2 (two) times daily.   40 capsule   0   . levothyroxine (SYNTHROID, LEVOTHROID) 50 MCG tablet   Oral   Take 50 mcg by mouth  daily.         . neomycin-polymyxin-hydrocortisone (CORTISPORIN) 3.5-10000-1 otic suspension   Right Ear   Place 4 drops into the right ear 3 (three) times daily.   10 mL   0   . ondansetron (ZOFRAN ODT) 4 MG disintegrating tablet      4mg  ODT q4 hours prn nausea/vomit   10 tablet   0     BP 143/90  Pulse 98  Temp(Src) 98.4 F (36.9 C) (Oral)  Resp 16  SpO2 100%  Breastfeeding? No  Physical Exam  Nursing note and vitals reviewed. Constitutional: She is oriented to person, place, and time. She appears well-developed. No distress.  Sitting calmly on the exam table. Severely obese.  HENT:  Mouth/Throat: Oropharynx is clear and moist. No oropharyngeal exudate.  Right external TM with discharge within the canal and dried discharge on the outer ear. The TM is red and dull. Left TM and EAC is normal.   Eyes: Conjunctivae and EOM are normal.  Neck: Normal range of motion. Neck supple.  Cardiovascular: Normal rate, regular rhythm and normal heart sounds.   Pulmonary/Chest: Effort normal and breath sounds normal. No respiratory distress. She has no wheezes. She has no rales.  Abdominal: Soft. She exhibits no mass. There is no rebound and no guarding.  Minor discomfort across the mid abdomen with palpation. No tenderness in the pelvis or lower abdomen.  Musculoskeletal: She exhibits no edema.  Tenderness across the lower para lumbar musculature.  Lymphadenopathy:    She has no cervical adenopathy.  Neurological: She is alert and oriented to person, place, and time. She exhibits normal muscle tone.  Skin: Skin is warm and dry.  Psychiatric: She has a normal mood and affect.    ED Course  Procedures (including critical care time)  Labs Reviewed  GLUCOSE, CAPILLARY - Abnormal; Notable for the following:    Glucose-Capillary 261 (*)    All other components within normal limits  POCT PREGNANCY, URINE   No results found.  Results for orders placed during the hospital encounter of 08/17/12  GLUCOSE, CAPILLARY      Result Value Range   Glucose-Capillary 261 (*) 70 - 99 mg/dL  POCT PREGNANCY, URINE      Result Value Range   Preg Test, Ur NEGATIVE  NEGATIVE      1. Right otitis externa   2. Right otitis media   3. Lumbar muscle pain   4. Abdominal pain in female patient   5. Diabetes mellitus   6. Hyperglycemia       MDM  Cortisporin Otic drops 4 drops in the right ear 3 times a day Amoxicillin thousand milligrams twice a day for 10 days Patient is advised that she has poor glycemic control any followup with her PCP. MiraLax as directed to help with bowel movements. She is not having acute abdomen. No signs of peritoneal irritation. The discomfort and minor tenderness is across the midabdomen and radiates to "the rectum". Suspect etiology is gastrointestinal. Gastroparesis is  also a consideration. Recommend she followup with her primary care physician later this week or early next week. Apply heat to the low back and limit lifting and bending.        Hayden Rasmussen, NP 08/17/12 2039

## 2012-08-17 NOTE — ED Notes (Signed)
Pt c/o lower back pain x 2 days. Denies urinary symptoms.   Stomach/abdominal pain x 2 wks pain comes and goes but now has become more constant along with nausea. Denies vomiting and diarrhea  Right ear pain x 2 wks. Most pain felt when cold air hits. Pain comes and goes.   Pt has used ear drops and ibuprofen for symptoms with no relief.

## 2012-08-18 LAB — POCT URINALYSIS DIP (DEVICE)
Bilirubin Urine: NEGATIVE
Glucose, UA: 500 mg/dL — AB
Specific Gravity, Urine: 1.025 (ref 1.005–1.030)
pH: 6 (ref 5.0–8.0)

## 2012-08-18 NOTE — ED Provider Notes (Signed)
Medical screening examination/treatment/procedure(s) were performed by non-physician practitioner and as supervising physician I was immediately available for consultation/collaboration.  Raynald Blend, MD 08/18/12 1045

## 2012-10-30 ENCOUNTER — Encounter (HOSPITAL_COMMUNITY): Payer: Self-pay | Admitting: Emergency Medicine

## 2012-10-30 ENCOUNTER — Emergency Department (HOSPITAL_COMMUNITY)
Admission: EM | Admit: 2012-10-30 | Discharge: 2012-10-31 | Disposition: A | Discharge status: Home / Self Care | Payer: Medicaid Other | Attending: Emergency Medicine | Admitting: Emergency Medicine

## 2012-10-30 ENCOUNTER — Emergency Department (HOSPITAL_COMMUNITY): Payer: Medicaid Other

## 2012-10-30 DIAGNOSIS — E039 Hypothyroidism, unspecified: Secondary | ICD-10-CM | POA: Insufficient documentation

## 2012-10-30 DIAGNOSIS — E119 Type 2 diabetes mellitus without complications: Secondary | ICD-10-CM | POA: Insufficient documentation

## 2012-10-30 DIAGNOSIS — Z79899 Other long term (current) drug therapy: Secondary | ICD-10-CM | POA: Insufficient documentation

## 2012-10-30 DIAGNOSIS — R109 Unspecified abdominal pain: Secondary | ICD-10-CM

## 2012-10-30 DIAGNOSIS — I10 Essential (primary) hypertension: Secondary | ICD-10-CM | POA: Insufficient documentation

## 2012-10-30 DIAGNOSIS — B379 Candidiasis, unspecified: Secondary | ICD-10-CM

## 2012-10-30 DIAGNOSIS — R197 Diarrhea, unspecified: Secondary | ICD-10-CM | POA: Insufficient documentation

## 2012-10-30 LAB — COMPREHENSIVE METABOLIC PANEL
Alkaline Phosphatase: 64 U/L (ref 39–117)
BUN: 5 mg/dL — ABNORMAL LOW (ref 6–23)
Calcium: 9.9 mg/dL (ref 8.4–10.5)
Creatinine, Ser: 0.68 mg/dL (ref 0.50–1.10)
GFR calc Af Amer: >90 mL/min (ref >90)
Glucose, Bld: 396 mg/dL — ABNORMAL HIGH (ref 70–99)
Total Protein: 7.7 g/dL (ref 6.0–8.3)

## 2012-10-30 LAB — CBC WITH DIFFERENTIAL/PLATELET
Basophils Relative: 0 % (ref 0–1)
Eosinophils Absolute: 0.1 10*3/uL (ref 0.0–0.7)
Eosinophils Relative: 1 % (ref 0–5)
Hemoglobin: 11.8 g/dL — ABNORMAL LOW (ref 12.0–15.0)
Lymphs Abs: 3.3 10*3/uL (ref 0.7–4.0)
MCH: 23.4 pg — ABNORMAL LOW (ref 26.0–34.0)
MCHC: 31.7 g/dL (ref 30.0–36.0)
MCV: 73.7 fL — ABNORMAL LOW (ref 78.0–100.0)
Monocytes Absolute: 0.5 10*3/uL (ref 0.1–1.0)
Monocytes Relative: 5 % (ref 3–12)
RBC: 5.05 MIL/uL (ref 3.87–5.11)

## 2012-10-30 LAB — URINALYSIS, ROUTINE W REFLEX MICROSCOPIC
Bilirubin Urine: NEGATIVE
Leukocytes, UA: NEGATIVE
Nitrite: NEGATIVE
Specific Gravity, Urine: 1.044 — ABNORMAL HIGH (ref 1.005–1.030)
Urobilinogen, UA: 0.2 mg/dL (ref 0.0–1.0)

## 2012-10-30 LAB — URINE MICROSCOPIC-ADD ON

## 2012-10-30 LAB — LIPASE, BLOOD: Lipase: 41 U/L (ref 11–59)

## 2012-10-30 MED ORDER — SODIUM CHLORIDE 0.9 % IV BOLUS (SEPSIS)
1000.0000 mL | Freq: Once | INTRAVENOUS | Status: AC
  Administered 2012-10-30: 1000 mL via INTRAVENOUS

## 2012-10-30 MED ORDER — GI COCKTAIL ~~LOC~~
30.0000 mL | Freq: Once | ORAL | Status: AC
  Administered 2012-10-30: 30 mL via ORAL
  Filled 2012-10-30: qty 30

## 2012-10-30 NOTE — ED Provider Notes (Signed)
History     CSN: [id number]  Arrival date & time 10/30/12  1751   First MD Initiated Contact with Patient 10/30/12 1753      Chief Complaint  Patient presents with  . Abdominal Pain    (Consider location/radiation/quality/duration/timing/severity/associated sxs/prior treatment) HPI Comments: Patient presents emergency department with chief complaint of intermittent diffuse abdominal pain times one month. She also endorses associated diarrhea x2 weeks. She denies seeing any blood in her stool. She describes her pain as sharp and crampy. She states the pain lasts for approximately 30 seconds, and then will go away but will come back once or twice every couple of hours. She states that she has been seen in urgent care for the same. She was treated with MiraLax, which helps a little bit. She denies fevers, chills, nausea, vomiting, constipation, dysuria, vaginal discharge. Patient states that nothing has changed her pain in the last month. She states that she simply wanted to be evaluated because it has been going on for so long.  The history is provided by the patient. No language interpreter was used.    Past Medical History  Diagnosis Date  . Thyroid disease   . Hypertension   . Diabetes mellitus   . Hypothyroidism     Past Surgical History  Procedure Laterality Date  . Wisdom tooth extraction    . Cesarean section  10/25/2011    Procedure: CESAREAN SECTION;  Surgeon: Lesly Dukes, MD;  Location: [facility];  Service: Gynecology;  Laterality: N/A;  Primary cesarean section of baby boy at 66 APGAR    Family History  Problem Relation Age of Onset  . Cancer Paternal Aunt   . Cancer Paternal Uncle   . Diabetes Paternal Grandmother   . Diabetes Paternal Grandfather   . Anesthesia problems Neg Hx     History  Substance Use Topics  . Smoking status: Never Smoker   . Smokeless tobacco: Never Used  . Alcohol Use: No    OB History   Grav Para Term Preterm Abortions TAB SAB Ect  Mult Living   1 1 0 1 0 0 0 0 0 1       Review of Systems  All other systems reviewed and are negative.    Allergies  Review of patient's allergies indicates no known allergies.  Home Medications   Current Outpatient Rx  Name  Route  Sig  Dispense  Refill  . levothyroxine (SYNTHROID, LEVOTHROID) 50 MCG tablet   Oral   Take 50 mcg by mouth daily.         . metFORMIN (GLUCOPHAGE) 500 MG tablet   Oral   Take 500 mg by mouth 2 (two) times daily with a meal.           BP 152/91  Pulse 111  Temp(Src) 97.9 F (36.6 C) (Oral)  Resp 18  Ht 5\' 2"  (1.575 m)  SpO2 98%  Physical Exam  Nursing note and vitals reviewed. Constitutional: She is oriented to person, place, and time. She appears well-developed and well-nourished.  HENT:  Head: Normocephalic and atraumatic.  Eyes: Conjunctivae and EOM are normal. Pupils are equal, round, and reactive to light.  Neck: Normal range of motion. Neck supple.  Cardiovascular: Normal rate and regular rhythm.  Exam reveals no gallop and no friction rub.   No murmur heard. Pulmonary/Chest: Effort normal and breath sounds normal. No respiratory distress. She has no wheezes. She has no rales. She exhibits no tenderness.  Abdominal: Soft. Bowel sounds  are normal. She exhibits no distension and no mass. There is no tenderness. There is no rebound and no guarding.  No focal abdominal tenderness, no signs of surgical or acute abdomen  Musculoskeletal: Normal range of motion. She exhibits no edema and no tenderness.  Neurological: She is alert and oriented to person, place, and time.  Skin: Skin is warm and dry.  Psychiatric: She has a normal mood and affect. Her behavior is normal. Judgment and thought content normal.    ED Course  Procedures (including critical care time)  Labs Reviewed  CBC WITH DIFFERENTIAL  COMPREHENSIVE METABOLIC PANEL  LIPASE, BLOOD  URINALYSIS, ROUTINE W REFLEX MICROSCOPIC   Results for orders placed during the  hospital encounter of 10/30/12  CBC WITH DIFFERENTIAL      Result Value Range   WBC 9.1  4.0 - 10.5 K/uL   RBC 5.05  3.87 - 5.11 MIL/uL   Hemoglobin 11.8 (*) 12.0 - 15.0 g/dL   HCT 16.1  09.6 - 04.5 %   MCV 73.7 (*) 78.0 - 100.0 fL   MCH 23.4 (*) 26.0 - 34.0 pg   MCHC 31.7  30.0 - 36.0 g/dL   RDW 40.9  81.1 - 91.4 %   Platelets 147 (*) 150 - 400 K/uL   Neutrophils Relative % 58  43 - 77 %   Neutro Abs 5.3  1.7 - 7.7 K/uL   Lymphocytes Relative 36  12 - 46 %   Lymphs Abs 3.3  0.7 - 4.0 K/uL   Monocytes Relative 5  3 - 12 %   Monocytes Absolute 0.5  0.1 - 1.0 K/uL   Eosinophils Relative 1  0 - 5 %   Eosinophils Absolute 0.1  0.0 - 0.7 K/uL   Basophils Relative 0  0 - 1 %   Basophils Absolute 0.0  0.0 - 0.1 K/uL  COMPREHENSIVE METABOLIC PANEL      Result Value Range   Sodium 134 (*) 135 - 145 mEq/L   Potassium 3.7  3.5 - 5.1 mEq/L   Chloride 97  96 - 112 mEq/L   CO2 29  19 - 32 mEq/L   Glucose, Bld 396 (*) 70 - 99 mg/dL   BUN 5 (*) 6 - 23 mg/dL   Creatinine, Ser 7.82  0.50 - 1.10 mg/dL   Calcium 9.9  8.4 - 95.6 mg/dL   Total Protein 7.7  6.0 - 8.3 g/dL   Albumin 3.8  3.5 - 5.2 g/dL   AST 59 (*) 0 - 37 U/L   ALT 66 (*) 0 - 35 U/L   Alkaline Phosphatase 64  39 - 117 U/L   Total Bilirubin 0.3  0.3 - 1.2 mg/dL   GFR calc non Af Amer >90  >90 mL/min   GFR calc Af Amer >90  >90 mL/min  LIPASE, BLOOD      Result Value Range   Lipase 41  11 - 59 U/L  URINALYSIS, ROUTINE W REFLEX MICROSCOPIC      Result Value Range   Color, Urine YELLOW  YELLOW   APPearance CLEAR  CLEAR   Specific Gravity, Urine 1.044 (*) 1.005 - 1.030   pH 5.5  5.0 - 8.0   Glucose, UA >1000 (*) NEGATIVE mg/dL   Hgb urine dipstick LARGE (*) NEGATIVE   Bilirubin Urine NEGATIVE  NEGATIVE   Ketones, ur NEGATIVE  NEGATIVE mg/dL   Protein, ur NEGATIVE  NEGATIVE mg/dL   Urobilinogen, UA 0.2  0.0 - 1.0 mg/dL  Nitrite NEGATIVE  NEGATIVE   Leukocytes, UA NEGATIVE  NEGATIVE  URINE MICROSCOPIC-ADD ON      Result  Value Range   Squamous Epithelial / LPF MANY (*) RARE   RBC / HPF 11-20  <3 RBC/hpf   Urine-Other MANY YEAST     US Abdomen Complete  10/31/2012   *RADIOLOGY REPORT*  Clinical Data:  22 year old female with abdominal pain, vomiting and diarrhea.  ABDOMINAL ULTRASOUND COMPLETE  Comparison:  None  Findings: Sensitivity is decreased secondary to patient body habitus and bowel gas.  Gallbladder:  The gallbladder is unremarkable. There is no evidence of gallstones, gallbladder wall thickening, or pericholecystic fluid.  Common Bile Duct:  There is no evidence of intrahepatic or extrahepatic biliary dilation. The CBD measures 4.1 mm in greatest diameter.  Liver: The liver is increased in echogenicity compatible with fatty infiltration. No focal abnormalities are identified.  IVC: Not well visualized secondary to overlying bowel gas  Pancreas: Not well visualized secondary to overlying bowel gas  Spleen:  Within normal limits in size and echotexture.  Right kidney:  The right kidney is normal in size and parenchymal echogenicity.  There is no evidence of solid mass, hydronephrosis or definite renal calculi.  The right kidney measures 15.1 cm.  Left kidney:  The left kidney is normal in size and parenchymal echogenicity.  There is no evidence of solid mass, hydronephrosis or definite renal calculi.   The left kidney measures 13.9 cm.  Abdominal Aorta: Not well visualized secondary to overlying bowel gas.  There is no evidence of ascites.  IMPRESSION: No evidence of acute abnormality.  Fatty infiltration of the liver.  IVC, pancreas and abdominal aorta not well visualized.   Original Report Authenticated By: Harmon Pier, M.D.      1. Abdominal  pain, other specified site   2. Yeast infection       MDM  Patient with abdominal pain times one month. We'll check basic labs, urinalysis, and urine pregnancy. Will reevaluate. Patient states that nothing is new or different about her pain. She just came in for  evaluation because it has been persistent times one month. Doubt acute/emergent process.  8:13 PM Discussed with Dr. Hyacinth Meeker, will order RUQ [place].  12:45 AM Ultrasound of right upper quadrant shows fatty liver, no acute process. Will discharge the patient to home with GI followup. I believe the patient needs to have a colonoscopy to rule out early Crohn's or to evaluate for IBS.        Roxy Horseman, PA-C 10/31/12 0105

## 2012-10-30 NOTE — ED Notes (Signed)
Patient presents to ED with c/o of intermittent sharp abdominal pain for about a month and diarrhea for about 2 weeks.

## 2012-10-31 MED ORDER — HYDROCODONE-ACETAMINOPHEN 5-325 MG PO TABS: 2.0000 | ORAL_TABLET | Freq: Four times a day (QID) | ORAL | Status: DC | PRN

## 2012-10-31 MED ORDER — FLUCONAZOLE 200 MG PO TABS: 200.0000 mg | ORAL_TABLET | Freq: Every day | ORAL | Status: AC

## 2012-10-31 MED ORDER — ONDANSETRON HCL 4 MG PO TABS: 4.0000 mg | ORAL_TABLET | Freq: Three times a day (TID) | ORAL | Status: DC | PRN

## 2012-10-31 MED ORDER — SODIUM CHLORIDE 0.9 % IV BOLUS (SEPSIS)
1000.0000 mL | Freq: Once | INTRAVENOUS | Status: AC
  Administered 2012-10-31: 1000 mL via INTRAVENOUS

## 2012-10-31 NOTE — ED Notes (Signed)
The patient is AOx4 and comfortable with the discharge instructions. 

## 2012-10-31 NOTE — ED Provider Notes (Signed)
  Medical screening examination/treatment/procedure(s) were performed by non-physician practitioner and as supervising physician I was immediately available for consultation/collaboration.    Vida Roller, MD 10/31/12 2123

## 2012-11-01 LAB — GLUCOSE, CAPILLARY: Glucose-Capillary: 291 mg/dL — ABNORMAL HIGH (ref 70–99)

## 2012-12-04 ENCOUNTER — Emergency Department (HOSPITAL_COMMUNITY)
Admission: EM | Admit: 2012-12-04 | Discharge: 2012-12-04 | Disposition: A | Discharge status: Home / Self Care | Payer: Medicaid Other | Source: Home / Self Care | Attending: Family Medicine | Admitting: Family Medicine

## 2012-12-04 ENCOUNTER — Encounter (HOSPITAL_COMMUNITY): Payer: Self-pay | Admitting: *Deleted

## 2012-12-04 DIAGNOSIS — IMO0002 Reserved for concepts with insufficient information to code with codable children: Secondary | ICD-10-CM

## 2012-12-04 DIAGNOSIS — E119 Type 2 diabetes mellitus without complications: Secondary | ICD-10-CM

## 2012-12-04 DIAGNOSIS — S43402A Unspecified sprain of left shoulder joint, initial encounter: Secondary | ICD-10-CM

## 2012-12-04 HISTORY — DX: Obesity, unspecified: E66.9

## 2012-12-04 LAB — GLUCOSE, CAPILLARY: Glucose-Capillary: 330 mg/dL — ABNORMAL HIGH (ref 70–99)

## 2012-12-04 MED ORDER — GLUCOSE BLOOD VI STRP: ORAL_STRIP | Status: DC

## 2012-12-04 MED ORDER — CYCLOBENZAPRINE HCL 10 MG PO TABS: 10.0000 mg | ORAL_TABLET | Freq: Two times a day (BID) | ORAL | Status: DC | PRN

## 2012-12-04 MED ORDER — IBUPROFEN 600 MG PO TABS: 600.0000 mg | ORAL_TABLET | Freq: Three times a day (TID) | ORAL | Status: DC | PRN

## 2012-12-04 MED ORDER — TRAMADOL HCL 50 MG PO TABS: 50.0000 mg | ORAL_TABLET | Freq: Three times a day (TID) | ORAL | Status: DC | PRN

## 2012-12-04 NOTE — ED Notes (Addendum)
C/O waking up with left shoulder pain; states "feels like a pull".  Area very tender to palpation, pain aggravated by deep breathing and movement.  Has taken a BC powder.  Also states she is out of her glucometer (Accucheck Aviva Plus) testing strips and lancets - does not yet have an appt with PCP.

## 2012-12-04 NOTE — ED Provider Notes (Signed)
History    CSN: [id number] Arrival date & time 12/04/12  1058  First MD Initiated Contact with Patient 12/04/12 1111     Chief Complaint  Patient presents with  . Shoulder Pain  . Medication Refill   (Consider location/radiation/quality/duration/timing/severity/associated sxs/prior Treatment) HPI Comments: 22 year old female with history of morbid obesity and diabetes. Here her with the following complaints: #1) left shoulder pain: States she was playing in the floor with her cousin and 6 month old son and felt like she pulled a muscle in her left shoulder. Pain worse with arm movement. Denies associated nausea or diaphoresis. Denies radiation to the chest. Denies shortness of breath. Has not taken any medications for this pain. #2) wants a refill on her glucose strips. States she has not checked her blood sugar in over a month. Patient reports she was getting fasting blood sugars in the 200 range despite of taking metformin 1 g twice a day before running out strips. She does have a primary care provider. Admits to poor diet and lack of exercise. Reports intermittent headache. Otherwise denies polyuria polydipsia or polyphagia. No dysuria. No dizziness. No abdominal pain nausea vomiting or diarrhea. Denies foot ulcers or infections.  Past Medical History  Diagnosis Date  . Hypertension   . Diabetes mellitus   . Obesity   . Thyroid disease   . Hypothyroidism    Past Surgical History  Procedure Laterality Date  . Wisdom tooth extraction    . Cesarean section  10/25/2011    Procedure: CESAREAN SECTION;  Surgeon: Lesly Dukes, MD;  Location: [facility];  Service: Gynecology;  Laterality: N/A;  Primary cesarean section of baby boy at 42 APGAR   Family History  Problem Relation Age of Onset  . Cancer Paternal Aunt   . Cancer Paternal Uncle   . Diabetes Paternal Grandmother   . Diabetes Paternal Grandfather   . Anesthesia problems Neg Hx    History  Substance Use Topics  .  Smoking status: Never Smoker   . Smokeless tobacco: Never Used  . Alcohol Use: No   OB History   Grav Para Term Preterm Abortions TAB SAB Ect Mult Living   1 1 0 1 0 0 0 0 0 1      Review of Systems  Constitutional: Negative for fever, chills, diaphoresis, activity change, appetite change, fatigue and unexpected weight change.  HENT: Negative for neck pain.   Respiratory: Negative for cough, chest tightness and shortness of breath.   Cardiovascular: Negative for chest pain, palpitations and leg swelling.  Gastrointestinal: Negative for nausea, vomiting, abdominal pain and diarrhea.  Endocrine: Negative for polydipsia, polyphagia and polyuria.  Musculoskeletal: Negative for joint swelling.       As per HPI  Skin: Negative for rash and wound.  Neurological: Negative for dizziness and headaches.    Allergies  Review of patient's allergies indicates no known allergies.  Home Medications   Current Outpatient Rx  Name  Route  Sig  Dispense  Refill  . metFORMIN (GLUCOPHAGE) 500 MG tablet   Oral   Take 1,000 mg by mouth 2 (two) times daily with a meal.          . cyclobenzaprine (FLEXERIL) 10 MG tablet   Oral   Take 1 tablet (10 mg total) by mouth 2 (two) times daily as needed for muscle spasms.   20 tablet   0   . ibuprofen (ADVIL,MOTRIN) 600 MG tablet   Oral   Take 1 tablet (600 mg  total) by mouth every 8 (eight) hours as needed for pain. Take with food.   21 tablet   0   . levothyroxine (SYNTHROID, LEVOTHROID) 50 MCG tablet   Oral   Take 50 mcg by mouth daily.         . traMADol (ULTRAM) 50 MG tablet   Oral   Take 1 tablet (50 mg total) by mouth every 8 (eight) hours as needed for pain.   21 tablet   0    BP 108/70  Pulse 107  Temp(Src) 97.8 F (36.6 C) (Oral)  Resp 18  SpO2 96%  LMP 11/26/2012 Physical Exam  Nursing note and vitals reviewed. Constitutional: She is oriented to person, place, and time. She appears well-developed and well-nourished.   HENT:  Head: Normocephalic and atraumatic.  Mouth/Throat: Oropharynx is clear and moist.  Neck: Neck supple. No JVD present. No thyromegaly present.  Cardiovascular: Normal rate, regular rhythm and normal heart sounds.   Pulmonary/Chest: Effort normal and breath sounds normal. No respiratory distress. She has no wheezes. She has no rales. She exhibits no tenderness.  Musculoskeletal:  Left shoulder: obese arm. No obvious deformity, no erythema, swelling, bruising or increased temperature. Diffused tenderness with palpation anterior and superior to glenohumeral joint. Limited range of motion due to pain but no signs of dislocation. Patient able to abduct left arm at shoulder joint past 90 degrees with reported moderate to severe pain. Positive empty can test. Entire left upper extremity appears neurovascularly intact.  Lymphadenopathy:    She has no cervical adenopathy.  Neurological: She is alert and oriented to person, place, and time.  Skin: No rash noted. She is not diaphoretic.    ED Course  Procedures (including critical care time) Labs Reviewed  GLUCOSE, CAPILLARY - Abnormal; Notable for the following:    Glucose-Capillary 330 (*)    All other components within normal limits   No results found. 1. Shoulder sprain, left, initial encounter   2. Diabetes     MDM  Treated with Flexeril, ibuprofen and tramadol. Refilled glucometer strips. Provided information for diabetes diet and exercise prescription. Patient is hyperglycemic but asymptomatic here. Recommended to continue metformin, adhere to a diabetic diet and followup with her primary care provider next week as she is going to need adjustment of her medications probably a new medication for diabetes. Supportive care and red flags that should prompt her return to medical attention discussed with patient and provided in writing.  Sharin Grave, MD 12/06/12 1743

## 2013-01-30 ENCOUNTER — Other Ambulatory Visit (HOSPITAL_COMMUNITY)
Admission: RE | Admit: 2013-01-30 | Discharge: 2013-01-30 | Disposition: A | Discharge status: Home / Self Care | Payer: Medicaid Other | Source: Ambulatory Visit | Attending: Emergency Medicine | Admitting: Emergency Medicine

## 2013-01-30 ENCOUNTER — Emergency Department (HOSPITAL_COMMUNITY)
Admission: EM | Admit: 2013-01-30 | Discharge: 2013-01-30 | Disposition: A | Discharge status: Home / Self Care | Payer: Medicaid Other | Source: Home / Self Care | Attending: Emergency Medicine | Admitting: Emergency Medicine

## 2013-01-30 ENCOUNTER — Encounter (HOSPITAL_COMMUNITY): Payer: Self-pay | Admitting: *Deleted

## 2013-01-30 DIAGNOSIS — E119 Type 2 diabetes mellitus without complications: Secondary | ICD-10-CM

## 2013-01-30 DIAGNOSIS — N760 Acute vaginitis: Secondary | ICD-10-CM

## 2013-01-30 DIAGNOSIS — Z113 Encounter for screening for infections with a predominantly sexual mode of transmission: Secondary | ICD-10-CM | POA: Insufficient documentation

## 2013-01-30 LAB — POCT I-STAT, CHEM 8
Creatinine, Ser: 0.7 mg/dL (ref 0.50–1.10)
Hemoglobin: 15.3 g/dL — ABNORMAL HIGH (ref 12.0–15.0)
Sodium: 137 mEq/L (ref 135–145)
TCO2: 27 mmol/L (ref 0–100)

## 2013-01-30 LAB — POCT URINALYSIS DIP (DEVICE)
Nitrite: NEGATIVE
Protein, ur: 100 mg/dL — AB
Specific Gravity, Urine: 1.01 (ref 1.005–1.030)
Urobilinogen, UA: 0.2 mg/dL (ref 0.0–1.0)

## 2013-01-30 MED ORDER — FINGERSTIX LANCETS MISC: Status: DC

## 2013-01-30 MED ORDER — GLUCOSE BLOOD VI STRP: ORAL_STRIP | Status: DC

## 2013-01-30 MED ORDER — TERCONAZOLE 0.8 % VA CREA: 1.0000 | TOPICAL_CREAM | Freq: Every day | VAGINAL | Status: DC

## 2013-01-30 MED ORDER — METRONIDAZOLE 500 MG PO TABS: 500.0000 mg | ORAL_TABLET | Freq: Two times a day (BID) | ORAL | Status: DC

## 2013-01-30 MED ORDER — FLUCONAZOLE 150 MG PO TABS: 150.0000 mg | ORAL_TABLET | Freq: Once | ORAL | Status: DC

## 2013-01-30 MED ORDER — GLIMEPIRIDE 4 MG PO TABS: 4.0000 mg | ORAL_TABLET | Freq: Every day | ORAL | Status: DC

## 2013-01-30 MED ORDER — METFORMIN HCL 1000 MG PO TABS: 1000.0000 mg | ORAL_TABLET | Freq: Two times a day (BID) | ORAL | Status: DC

## 2013-01-30 NOTE — ED Provider Notes (Signed)
Chief Complaint:   Chief Complaint  Patient presents with  . Vaginal Discharge    History of Present Illness:   Kyleena Cove is a 22 year old female who has had a three-day history of vaginal discharge with itching, burning, and external vulvar swelling, and pain. She's had some odor. She denies any lower back pain or urinary symptoms. She's had some pelvic pain. Her menses are irregular. She is on Depo-Provera for birth control. She denies being pregnant or breast-feeding. She's had no fever, chills, nausea, or vomiting. She also has a 9 year history of type 2 diabetes. She's been on metformin 1000 mg twice daily. Her blood sugars are not well-controlled running from 200-400. She denies polyuria, polydipsia, blurry vision, or abdominal pain.  Review of Systems:  Other than noted above, the patient denies any of the following symptoms: Systemic:  No fever, chills, sweats, or weight loss. GI:  No abdominal pain, nausea, anorexia, vomiting, diarrhea, constipation, melena or hematochezia. GU:  No dysuria, frequency, urgency, hematuria, vaginal discharge, itching, or abnormal vaginal bleeding. Skin:  No rash or itching.  PMFSH:  Past medical history, family history, social history, meds, and allergies were reviewed.  She also has high blood pressure and hypothyroidism.  Physical Exam:   Vital signs:  BP 146/84  Pulse 116  Temp(Src) 98.9 F (37.2 C) (Oral)  Resp 19  SpO2 100% General:  Alert, oriented and in no distress. She is morbidly obese. Lungs:  Breath sounds clear and equal bilaterally.  No wheezes, rales or rhonchi. Heart:  Regular rhythm.  No gallops or murmers. Abdomen:  Soft, flat and non-distended.  No organomegaly or mass.  No tenderness, guarding or rebound.  Bowel sounds normally active. Pelvic exam:  Normal external genitalia. Vaginal and cervical mucosa were normal. There is a scant amount of yellowish discharge. No pain on cervical motion. Uterus was normal in size and  shape and nontender. No adnexal masses or tenderness. Skin:  Clear, warm and dry.  Labs:   Results for orders placed during the hospital encounter of 01/30/13  POCT URINALYSIS DIP (DEVICE)      Result Value Range   Glucose, UA 500 (*) NEGATIVE mg/dL   Bilirubin Urine NEGATIVE  NEGATIVE   Ketones, ur NEGATIVE  NEGATIVE mg/dL   Specific Gravity, Urine 1.010  1.005 - 1.030   Hgb urine dipstick TRACE (*) NEGATIVE   pH 5.0  5.0 - 8.0   Protein, ur 100 (*) NEGATIVE mg/dL   Urobilinogen, UA 0.2  0.0 - 1.0 mg/dL   Nitrite NEGATIVE  NEGATIVE   Leukocytes, UA NEGATIVE  NEGATIVE  POCT I-STAT, CHEM 8      Result Value Range   Sodium 137  135 - 145 mEq/L   Potassium 4.2  3.5 - 5.1 mEq/L   Chloride 98  96 - 112 mEq/L   BUN 7  6 - 23 mg/dL   Creatinine, Ser 7.82  0.50 - 1.10 mg/dL   Glucose, Bld 956 (*) 70 - 99 mg/dL   Calcium, Ion 2.13  0.86 - 1.23 mmol/L   TCO2 27  0 - 100 mmol/L   Hemoglobin 15.3 (*) 12.0 - 15.0 g/dL   HCT 57.8  46.9 - 62.9 %  POCT PREGNANCY, URINE      Result Value Range   Preg Test, Ur NEGATIVE  NEGATIVE    DNA probes for gonorrhea, Chlamydia, Gardnerella, Trichomonas, and yeast were obtained.  Assessment:  The primary encounter diagnosis was Vaginitis. A diagnosis of Type 2  diabetes mellitus was also pertinent to this visit.  I think she has a combination of Candida and bacterial vaginosis. I think the yeast infection is most likely due to diabetes being poorly controlled. I have added glimepiride 4 mg a day to her diabetes regimen and refill her metformin, glucose test strips, and lancets. Will treat for both yeast and bacterial vaginosis with Diflucan, terconazole, and Flagyl.  Plan:   1.  Meds:  The following meds were prescribed:   New Prescriptions   FINGERSTIX LANCETS MISC    Use for glucose testing once daily   FLUCONAZOLE (DIFLUCAN) 150 MG TABLET    Take 1 tablet (150 mg total) by mouth once.   GLIMEPIRIDE (AMARYL) 4 MG TABLET    Take 1 tablet (4 mg total)  by mouth daily before breakfast.   GLUCOSE BLOOD TEST STRIP    Use as instructed   METFORMIN (GLUCOPHAGE) 1000 MG TABLET    Take 1 tablet (1,000 mg total) by mouth 2 (two) times daily.   METRONIDAZOLE (FLAGYL) 500 MG TABLET    Take 1 tablet (500 mg total) by mouth 2 (two) times daily.   TERCONAZOLE (TERAZOL 3) 0.8 % VAGINAL CREAM    Place 1 applicator vaginally at bedtime.    2.  Patient Education/Counseling:  The patient was given appropriate handouts, self care instructions, and instructed in symptomatic relief.  Advised to control her diabetes as closely as possible by means of diet and weight loss.  3.  Follow up:  The patient was told to follow up if no better in 3 to 4 days, if becoming worse in any way, and given some red flag symptoms such as increasing pelvic pain or fever which would prompt immediate return.  Follow up with her primary care physician as soon as possible.     Reuben Likes, MD 01/30/13 [phone]

## 2013-01-30 NOTE — ED Notes (Signed)
Pt  Reports  Symptoms  Of  Vaginal  Discharge  /  Itching /  Burning        Pt  Reports  The  Symptoms  Not  releived  By  Yogurts  And  OTC  Creams      Pt  Ambulated to  Room with a  Slow  Steady  Gait

## 2013-01-31 ENCOUNTER — Telehealth (HOSPITAL_COMMUNITY): Payer: Self-pay | Admitting: *Deleted

## 2013-01-31 ENCOUNTER — Telehealth (HOSPITAL_COMMUNITY): Payer: Self-pay | Admitting: Emergency Medicine

## 2013-01-31 MED ORDER — AZITHROMYCIN 500 MG PO TABS: 1000.0000 mg | ORAL_TABLET | Freq: Once | ORAL | Status: DC

## 2013-01-31 NOTE — Telephone Encounter (Signed)
Message copied by Reuben Likes on Mon Jan 31, 2013  5:13 PM ------      Message from: Vassie Moselle      Created: Mon Jan 31, 2013  4:56 PM      Regarding: labs       Chlamydia, Candida and Trich pos. Did not see treatment for Chlamydia. She got Diflucan, Terazol cream and Flagyl.      Vassie Moselle      01/31/2013       ------

## 2013-01-31 NOTE — ED Notes (Addendum)
DNA probes came back positive for Chlamydia, Candida, and Gardnerella. She was treated for Candida and Gardnerella. She was not given any treatment for Chlamydia. I will call in a prescription for azithromycin 1000 mg one time only to her pharmacy.  Reuben Likes, MD 01/31/13 1713  DNA probe was positive for Chlamydia, Candida, Trichomonas.  Reuben Likes, MD 01/31/13 [phone]

## 2013-01-31 NOTE — ED Notes (Signed)
I called pt. Pt. verified x 2 and given results.  Pt. told she was adequately treated for yeast infection with Diflucan and Terazol cream and Flagyl for Trich. Pt. instructed to finish all of medication, to notify her partner to be treated with Flagyl, no sex until she has finished herr medication and her partner has been treated and to practice safe sex. You can get HIV testing at the [facility] STD clinic, by appointment. Pt. told she has a Rx. of Zithromax at the [facility] at [address] and [address]. Take all of medication at one time with food. Call back if she vomits the medication.  Notify your partner to be treated for this also. No sex for 1 week after medication.  Pt. voiced understanding. DHHS form completed and faxed to the [facility] Department. Vassie Moselle 01/31/2013

## 2013-05-04 ENCOUNTER — Encounter (HOSPITAL_COMMUNITY): Payer: Self-pay | Admitting: Emergency Medicine

## 2013-05-04 ENCOUNTER — Emergency Department (HOSPITAL_COMMUNITY)
Admission: EM | Admit: 2013-05-04 | Discharge: 2013-05-04 | Disposition: A | Discharge status: Home / Self Care | Payer: Medicaid Other | Source: Home / Self Care

## 2013-05-04 DIAGNOSIS — J069 Acute upper respiratory infection, unspecified: Secondary | ICD-10-CM

## 2013-05-04 DIAGNOSIS — B349 Viral infection, unspecified: Secondary | ICD-10-CM

## 2013-05-04 DIAGNOSIS — B9789 Other viral agents as the cause of diseases classified elsewhere: Secondary | ICD-10-CM

## 2013-05-04 MED ORDER — ONDANSETRON HCL 4 MG PO TABS: 4.0000 mg | ORAL_TABLET | Freq: Four times a day (QID) | ORAL | Status: DC

## 2013-05-04 NOTE — ED Provider Notes (Signed)
CSN: [id number]     Arrival date & time 05/04/13  1807 History   First MD Initiated Contact with Patient 05/04/13 1853     Chief Complaint  Patient presents with  . Generalized Body Aches   (Consider location/radiation/quality/duration/timing/severity/associated sxs/prior Treatment) HPI Comments: 22 year old severely obese female complains of bodyaches, intermittent vomiting, earache, sore throat, cough and runny nose for 3 days. She states her temperature has been between 99 and 100. She has been out of school for the past couple of days.   Past Medical History  Diagnosis Date  . Hypertension   . Diabetes mellitus   . Obesity   . Thyroid disease   . Hypothyroidism    Past Surgical History  Procedure Laterality Date  . Wisdom tooth extraction    . Cesarean section  10/25/2011    Procedure: CESAREAN SECTION;  Surgeon: Lesly Dukes, MD;  Location: [facility];  Service: Gynecology;  Laterality: N/A;  Primary cesarean section of baby boy at 34 APGAR   Family History  Problem Relation Age of Onset  . Cancer Paternal Aunt   . Cancer Paternal Uncle   . Diabetes Paternal Grandmother   . Diabetes Paternal Grandfather   . Anesthesia problems Neg Hx    History  Substance Use Topics  . Smoking status: Never Smoker   . Smokeless tobacco: Never Used  . Alcohol Use: No   OB History   Grav Para Term Preterm Abortions TAB SAB Ect Mult Living   1 1 0 1 0 0 0 0 0 1      Review of Systems  Constitutional: Positive for fever, activity change, appetite change and fatigue. Negative for chills.  HENT: Positive for congestion, ear pain and postnasal drip. Negative for facial swelling.   Eyes: Negative.   Respiratory: Positive for cough. Negative for shortness of breath.   Cardiovascular: Negative.   Gastrointestinal: Positive for nausea and vomiting.       Vomiting is better changed to have intermittent nausea. States she is able to drink fluids and hold them down.  Musculoskeletal:  Positive for myalgias. Negative for neck pain and neck stiffness.  Skin: Negative for pallor and rash.  Neurological: Negative.  Negative for speech difficulty.    Allergies  Review of patient's allergies indicates no known allergies.  Home Medications   Current Outpatient Rx  Name  Route  Sig  Dispense  Refill  . azithromycin (ZITHROMAX) 500 MG tablet   Oral   Take 2 tablets (1,000 mg total) by mouth once.   2 tablet   0   . cyclobenzaprine (FLEXERIL) 10 MG tablet   Oral   Take 1 tablet (10 mg total) by mouth 2 (two) times daily as needed for muscle spasms.   20 tablet   0   . Fingerstix Lancets MISC      Use for glucose testing once daily   100 each   12   . fluconazole (DIFLUCAN) 150 MG tablet   Oral   Take 1 tablet (150 mg total) by mouth once.   1 tablet   5   . glimepiride (AMARYL) 4 MG tablet   Oral   Take 1 tablet (4 mg total) by mouth daily before breakfast.   30 tablet   1   . glucose blood test strip      Please dispense patient preferred brand. #1 box to Use as instructed   100 each   0   . glucose blood test strip  Use as instructed   100 each   12   . ibuprofen (ADVIL,MOTRIN) 600 MG tablet   Oral   Take 1 tablet (600 mg total) by mouth every 8 (eight) hours as needed for pain. Take with food.   21 tablet   0   . levothyroxine (SYNTHROID, LEVOTHROID) 50 MCG tablet   Oral   Take 50 mcg by mouth daily.         . metFORMIN (GLUCOPHAGE) 1000 MG tablet   Oral   Take 1 tablet (1,000 mg total) by mouth 2 (two) times daily.   30 tablet   1   . metFORMIN (GLUCOPHAGE) 500 MG tablet   Oral   Take 1,000 mg by mouth 2 (two) times daily with a meal.          . metroNIDAZOLE (FLAGYL) 500 MG tablet   Oral   Take 1 tablet (500 mg total) by mouth 2 (two) times daily.   14 tablet   0   . ondansetron (ZOFRAN) 4 MG tablet   Oral   Take 1 tablet (4 mg total) by mouth every 6 (six) hours.   12 tablet   0   . terconazole (TERAZOL 3)  0.8 % vaginal cream   Vaginal   Place 1 applicator vaginally at bedtime.   20 g   0   . traMADol (ULTRAM) 50 MG tablet   Oral   Take 1 tablet (50 mg total) by mouth every 8 (eight) hours as needed for pain.   21 tablet   0    BP 138/97  Pulse 108  Temp(Src) 98.8 F (37.1 C) (Oral)  Resp 25  SpO2 96% Physical Exam  Nursing note and vitals reviewed. Constitutional: She is oriented to person, place, and time. She appears well-developed and well-nourished. No distress.  HENT:  Bilateral TMs without signs of infection. Right TM with retraction. Oropharynx with minimal erythema otherwise clear and no exudates.  Eyes: Conjunctivae are normal.  Neck: Normal range of motion. Neck supple.  Cardiovascular: Normal rate, regular rhythm and normal heart sounds.   Pulmonary/Chest: Effort normal and breath sounds normal. No respiratory distress. She has no wheezes. She has no rales.  Abdominal: Soft. She exhibits no distension. There is no tenderness.  Musculoskeletal: Normal range of motion. She exhibits no edema.  Lymphadenopathy:    She has no cervical adenopathy.  Neurological: She is alert and oriented to person, place, and time. She exhibits normal muscle tone.  Skin: Skin is warm and dry. No rash noted.  Psychiatric: She has a normal mood and affect.    ED Course  Procedures (including critical care time) Labs Review Labs Reviewed - No data to display Imaging Review No results found.    MDM   1. Viral syndrome   2. URI (upper respiratory infection)     Plenty of fluids Alkaseltzer Plus and Robitussin Dm Rest  OOW today and tomorrow     Hayden Rasmussen, NP 05/04/13 [phone]

## 2013-05-04 NOTE — ED Notes (Signed)
C/o onset 12-7 of general body aches, congestion ( w clear secretions) coughing until gagging

## 2013-05-04 NOTE — ED Provider Notes (Signed)
Medical screening examination/treatment/procedure(s) were performed by non-physician practitioner and as supervising physician I was immediately available for consultation/collaboration.  Leslee Home, M.D.   Reuben Likes, MD 05/04/13 [phone]

## 2013-05-05 LAB — POCT RAPID STREP A: Streptococcus, Group A Screen (Direct): NEGATIVE

## 2013-05-07 LAB — CULTURE, GROUP A STREP

## 2013-05-08 ENCOUNTER — Telehealth (HOSPITAL_COMMUNITY): Payer: Self-pay | Admitting: Emergency Medicine

## 2013-05-08 MED ORDER — AMOXICILLIN 500 MG PO CAPS: 500.0000 mg | ORAL_CAPSULE | Freq: Three times a day (TID) | ORAL | Status: DC

## 2013-05-08 NOTE — ED Notes (Signed)
Throat culture showed group A strep. Patient has no known allergies. Will treat with amoxicillin 500 mg, #30, one 3 times a day for 10 days.  Reuben Likes, MD 05/08/13 [phone]

## 2013-05-09 ENCOUNTER — Telehealth (HOSPITAL_COMMUNITY): Payer: Self-pay | Admitting: *Deleted

## 2013-05-09 NOTE — ED Notes (Signed)
I called pt. Pt. verified x 2 and given results.  Pt. told she needs Amoxicillin for Strep throat.  Pt. told where to pick up her Rx. and to take all of the medication. If anyone she exposed gets the same symptoms, they should get checked for strep also. Pt. voiced understanding. Vassie Moselle 05/09/2013

## 2013-05-13 ENCOUNTER — Emergency Department (HOSPITAL_COMMUNITY)
Admission: EM | Admit: 2013-05-13 | Discharge: 2013-05-13 | Disposition: A | Discharge status: Home / Self Care | Payer: Medicaid Other | Attending: Emergency Medicine | Admitting: Emergency Medicine

## 2013-05-13 ENCOUNTER — Encounter (HOSPITAL_COMMUNITY): Payer: Self-pay | Admitting: Emergency Medicine

## 2013-05-13 ENCOUNTER — Emergency Department (HOSPITAL_COMMUNITY): Payer: Medicaid Other

## 2013-05-13 DIAGNOSIS — J020 Streptococcal pharyngitis: Secondary | ICD-10-CM | POA: Insufficient documentation

## 2013-05-13 DIAGNOSIS — R0789 Other chest pain: Secondary | ICD-10-CM | POA: Insufficient documentation

## 2013-05-13 DIAGNOSIS — B349 Viral infection, unspecified: Secondary | ICD-10-CM

## 2013-05-13 DIAGNOSIS — Z79899 Other long term (current) drug therapy: Secondary | ICD-10-CM | POA: Insufficient documentation

## 2013-05-13 DIAGNOSIS — H9209 Otalgia, unspecified ear: Secondary | ICD-10-CM | POA: Insufficient documentation

## 2013-05-13 DIAGNOSIS — E669 Obesity, unspecified: Secondary | ICD-10-CM | POA: Insufficient documentation

## 2013-05-13 DIAGNOSIS — R51 Headache: Secondary | ICD-10-CM | POA: Insufficient documentation

## 2013-05-13 DIAGNOSIS — J069 Acute upper respiratory infection, unspecified: Secondary | ICD-10-CM | POA: Insufficient documentation

## 2013-05-13 DIAGNOSIS — I76 Septic arterial embolism: Secondary | ICD-10-CM | POA: Insufficient documentation

## 2013-05-13 DIAGNOSIS — IMO0001 Reserved for inherently not codable concepts without codable children: Secondary | ICD-10-CM | POA: Insufficient documentation

## 2013-05-13 DIAGNOSIS — E119 Type 2 diabetes mellitus without complications: Secondary | ICD-10-CM | POA: Insufficient documentation

## 2013-05-13 DIAGNOSIS — R5381 Other malaise: Secondary | ICD-10-CM | POA: Insufficient documentation

## 2013-05-13 DIAGNOSIS — B9789 Other viral agents as the cause of diseases classified elsewhere: Secondary | ICD-10-CM | POA: Insufficient documentation

## 2013-05-13 DIAGNOSIS — R630 Anorexia: Secondary | ICD-10-CM | POA: Insufficient documentation

## 2013-05-13 DIAGNOSIS — R0602 Shortness of breath: Secondary | ICD-10-CM | POA: Insufficient documentation

## 2013-05-13 DIAGNOSIS — I10 Essential (primary) hypertension: Secondary | ICD-10-CM | POA: Insufficient documentation

## 2013-05-13 DIAGNOSIS — R599 Enlarged lymph nodes, unspecified: Secondary | ICD-10-CM | POA: Insufficient documentation

## 2013-05-13 DIAGNOSIS — R062 Wheezing: Secondary | ICD-10-CM | POA: Insufficient documentation

## 2013-05-13 DIAGNOSIS — R000 Tachycardia, unspecified: Secondary | ICD-10-CM | POA: Insufficient documentation

## 2013-05-13 MED ORDER — IBUPROFEN 400 MG PO TABS
800.0000 mg | ORAL_TABLET | Freq: Once | ORAL | Status: AC
  Administered 2013-05-13: 800 mg via ORAL
  Filled 2013-05-13: qty 2

## 2013-05-13 MED ORDER — ALBUTEROL SULFATE HFA 108 (90 BASE) MCG/ACT IN AERS: 2.0000 | INHALATION_SPRAY | RESPIRATORY_TRACT | Status: DC | PRN

## 2013-05-13 MED ORDER — OXYCODONE-ACETAMINOPHEN 5-325 MG PO TABS
2.0000 | ORAL_TABLET | Freq: Once | ORAL | Status: AC
  Administered 2013-05-13: 2 via ORAL
  Filled 2013-05-13: qty 2

## 2013-05-13 MED ORDER — HYDROCODONE-ACETAMINOPHEN 5-325 MG PO TABS: 1.0000 | ORAL_TABLET | ORAL | Status: DC | PRN

## 2013-05-13 MED ORDER — ALBUTEROL SULFATE (5 MG/ML) 0.5% IN NEBU
5.0000 mg | INHALATION_SOLUTION | Freq: Once | RESPIRATORY_TRACT | Status: AC
  Administered 2013-05-13: 5 mg via RESPIRATORY_TRACT
  Filled 2013-05-13: qty 1

## 2013-05-13 MED ORDER — DEXAMETHASONE SODIUM PHOSPHATE 10 MG/ML IJ SOLN
10.0000 mg | Freq: Once | INTRAMUSCULAR | Status: AC
  Administered 2013-05-13: 10 mg via INTRAMUSCULAR
  Filled 2013-05-13: qty 1

## 2013-05-13 NOTE — ED Notes (Signed)
Patient transported to X-ray 

## 2013-05-13 NOTE — ED Notes (Signed)
Pt c/o sore throat, productive cough with green colored mucus, chest and nasal congestion x2 weeks, pt reports she started  taking Amoxicillin on 05/08/2013 with no relief

## 2013-05-13 NOTE — ED Provider Notes (Signed)
Medical screening examination/treatment/procedure(s) were performed by non-physician practitioner and as supervising physician I was immediately available for consultation/collaboration.  EKG Interpretation   None         Layla Maw Shiro Ellerman, DO 05/13/13 2332

## 2013-05-13 NOTE — ED Provider Notes (Signed)
CSN: [id number]     Arrival date & time 05/13/13  1813 History   First MD Initiated Contact with Patient 05/13/13 1849     This chart was scribed for non-physician practitioner, Dierdre Forth PA-C working with Layla Maw Ward, DO by Arlan Organ, ED Scribe. This patient was seen in room TR10C/TR10C and the patient's care was started at 7:27 PM.   Chief Complaint  Patient presents with  . Sore Throat    Patient is a 22 y.o. female presenting with pharyngitis. The history is provided by the patient and medical records. No language interpreter was used.  Sore Throat This is a recurrent problem. The current episode started more than 1 week ago. The problem occurs constantly. The problem has been gradually worsening. Associated symptoms include headaches and shortness of breath. Pertinent negatives include no chest pain and no abdominal pain.  Sore Throat This is a recurrent problem. The current episode started more than 1 week ago. The problem occurs constantly. The problem has been gradually worsening. Associated symptoms include congestion, coughing, fatigue, headaches, myalgias and a sore throat. Pertinent negatives include no abdominal pain, arthralgias, chest pain, chills, fever, nausea, numbness, rash or vomiting.    HPI Comments: Sandra House is a 23 y.o. female with a h/o DM, HTN who presents to the Emergency Department complaining of a gradual onset, gradually worsening sore throat that initially started 10 days ago. She also reports right otalgia and intermittent SOB. Pt states she was recently diagnosed with the Flu followed by strep throat. She was put on amoxicillin 12/14, and reports taking this antibiotic as directed. Pt states some of her previous symptoms have resolved, but still feels her current symptoms are worsening. She states she has not been eating as normal, but reports regular fluid intake. She states she has not checked her blood sugar in the last week due to being  sick. She denies currently breast feeding.   Past Medical History  Diagnosis Date  . Hypertension   . Diabetes mellitus   . Obesity   . Thyroid disease   . Hypothyroidism    Past Surgical History  Procedure Laterality Date  . Wisdom tooth extraction    . Cesarean section  10/25/2011    Procedure: CESAREAN SECTION;  Surgeon: Lesly Dukes, MD;  Location: [facility];  Service: Gynecology;  Laterality: N/A;  Primary cesarean section of baby boy at 2 APGAR   Family History  Problem Relation Age of Onset  . Cancer Paternal Aunt   . Cancer Paternal Uncle   . Diabetes Paternal Grandmother   . Diabetes Paternal Grandfather   . Anesthesia problems Neg Hx    History  Substance Use Topics  . Smoking status: Never Smoker   . Smokeless tobacco: Never Used  . Alcohol Use: No   OB History   Grav Para Term Preterm Abortions TAB SAB Ect Mult Living   1 1 0 1 0 0 0 0 0 1      Review of Systems  Constitutional: Positive for fatigue. Negative for fever, chills and appetite change.  HENT: Positive for congestion, postnasal drip, rhinorrhea, sinus pressure and sore throat. Negative for ear discharge, ear pain and mouth sores.   Eyes: Negative for visual disturbance.  Respiratory: Positive for cough, chest tightness, shortness of breath and wheezing. Negative for stridor.   Cardiovascular: Negative for chest pain, palpitations and leg swelling.  Gastrointestinal: Negative for nausea, vomiting, abdominal pain and diarrhea.  Genitourinary: Negative for dysuria, urgency, frequency and  hematuria.  Musculoskeletal: Positive for myalgias. Negative for arthralgias, back pain and neck stiffness.  Skin: Negative for rash.  Neurological: Positive for headaches. Negative for syncope, light-headedness and numbness.  Hematological: Negative for adenopathy.  Psychiatric/Behavioral: The patient is not nervous/anxious.   All other systems reviewed and are negative.    Allergies  Review of patient's  allergies indicates no known allergies.  Home Medications   Current Outpatient Rx  Name  Route  Sig  Dispense  Refill  . amoxicillin (AMOXIL) 500 MG capsule   Oral   Take 1 capsule (500 mg total) by mouth 3 (three) times daily.   30 capsule   0     Dispense as written.   . Fingerstix Lancets MISC      Use for glucose testing once daily   100 each   12   . glimepiride (AMARYL) 4 MG tablet   Oral   Take 1 tablet (4 mg total) by mouth daily before breakfast.   30 tablet   1   . glucose blood test strip      Use as instructed   100 each   12   . metFORMIN (GLUCOPHAGE) 1000 MG tablet   Oral   Take 1 tablet (1,000 mg total) by mouth 2 (two) times daily.   30 tablet   1   . albuterol (PROVENTIL HFA;VENTOLIN HFA) 108 (90 BASE) MCG/ACT inhaler   Inhalation   Inhale 2 puffs into the lungs every 4 (four) hours as needed for wheezing or shortness of breath.   1 Inhaler   3   . HYDROcodone-acetaminophen (NORCO/VICODIN) 5-325 MG per tablet   Oral   Take 1 tablet by mouth every 4 (four) hours as needed for severe pain.   6 tablet   0    Triage Vitals: BP 148/81  Pulse 120  Temp(Src) 98.5 F (36.9 C) (Oral)  Resp 16  SpO2 96%  Physical Exam  Constitutional: She is oriented to person, place, and time. She appears well-developed and well-nourished. No distress.  HENT:  Head: Normocephalic and atraumatic.  Right Ear: Tympanic membrane, external ear and ear canal normal.  Left Ear: Tympanic membrane, external ear and ear canal normal.  Nose: Mucosal edema and rhinorrhea present. No epistaxis. Right sinus exhibits no maxillary sinus tenderness and no frontal sinus tenderness. Left sinus exhibits no maxillary sinus tenderness and no frontal sinus tenderness.  Mouth/Throat: Uvula is midline and mucous membranes are normal. Mucous membranes are not pale and not cyanotic. No uvula swelling. Posterior oropharyngeal edema and posterior oropharyngeal erythema present. No  oropharyngeal exudate or tonsillar abscesses.  Posterior Oropharyngeal mild edema and erythema without exudate No uvula swelling No evidence of peritonsillar abscess  Eyes: Conjunctivae are normal. Pupils are equal, round, and reactive to light.  Neck: Normal range of motion and full passive range of motion without pain. Neck supple.  Mild anterior cervical adenopathy   Cardiovascular: Regular rhythm, S1 normal, S2 normal, normal heart sounds and intact distal pulses.  Tachycardia present.   Pulses:      Radial pulses are 2+ on the right side, and 2+ on the left side.  Mild Tachycardia  Pulmonary/Chest: Effort normal. No accessory muscle usage or stridor. Not tachypneic. No respiratory distress. She has decreased breath sounds. She has no wheezes. She has no rhonchi. She has no rales.  Sounds diminished throughout without Rales, rhonchi or wheezes No respiratory distress  Abdominal: Soft. Bowel sounds are normal. There is no tenderness.  Musculoskeletal:  Normal range of motion.  Lymphadenopathy:    She has cervical adenopathy.  Neurological: She is alert and oriented to person, place, and time.  Skin: Skin is dry. No rash noted. She is not diaphoretic.  Psychiatric: She has a normal mood and affect.  Patient tearful    ED Course  Procedures (including critical care time)  DIAGNOSTIC STUDIES: Oxygen Saturation is 96% on RA, adequate by my interpretation.    COORDINATION OF CARE: 7:27 PM- Will give pain medication and a breathing treatment. Will order chest X-Ray. Discussed treatment plan with pt at bedside and pt agreed to plan.     Labs Review Labs Reviewed - No data to display Imaging Review Dg Chest 2 View  05/13/2013   CLINICAL DATA:  Cough and congestion.  EXAM: CHEST  2 VIEW  COMPARISON:  Chest x-ray 02/15/2012.  FINDINGS: Lung volumes are normal. No consolidative airspace disease. No pleural effusions. No pneumothorax. No pulmonary nodule or mass noted. Pulmonary  vasculature and the cardiomediastinal silhouette are within normal limits.  IMPRESSION: 1.  No radiographic evidence of acute cardiopulmonary disease.   Electronically Signed   By: Trudie Reed M.D.   On: 05/13/2013 20:55    EKG Interpretation   None       MDM   1. Viral URI with cough   2. Strep pharyngitis   3. Viral syndrome      Sandra House presents with persistent sore throat and increasing shortness of breath.  Record review shows that throat culture revealed group A strep.  On exam patient with decreased breath sounds throughout but no Rales, rhonchi or wheezes. Patient is tearful secondary to feeling ill.  Will give Decadron, pain medication, albuterol nebulizer and obtain chest x-ray.  9:26 PM X-ray without evidence of pneumonia, pneumothorax or pulmonary edema.  I personally reviewed the imaging tests through PACS system  I reviewed available ER/hospitalization records through the EMR  Patient with significant improvement after medications. Increase tidal volume and lungs remain clear and equal.  Doubt pneumonia. Patient reports decrease in through pain and feeling much better.  Will d/c home with albuterol and Vicodin.  Recommend PCP follow-up early next week.  It has been determined that no acute conditions requiring further emergency intervention are present at this time. The patient/guardian have been advised of the diagnosis and plan. We have discussed signs and symptoms that warrant return to the ED, such as changes or worsening in symptoms.   Vital signs are stable at discharge; patient with mild tachycardia after a nebulizer  BP 136/77  Pulse 107  Temp(Src) 99 F (37.2 C) (Oral)  Resp 18  SpO2 96%  Patient/guardian has voiced understanding and agreed to follow-up with the PCP or specialist.    I personally performed the services described in this documentation, which was scribed in my presence. The recorded information has been reviewed and is  accurate.    Sandra Client Rashawnda Gaba, PA-C 05/13/13 2149

## 2013-07-16 ENCOUNTER — Emergency Department (HOSPITAL_COMMUNITY)
Admission: EM | Admit: 2013-07-16 | Discharge: 2013-07-16 | Discharge status: Against Medical Advice | Payer: Medicaid Other | Attending: Emergency Medicine | Admitting: Emergency Medicine

## 2013-07-16 ENCOUNTER — Emergency Department (HOSPITAL_COMMUNITY): Payer: Medicaid Other

## 2013-07-16 ENCOUNTER — Encounter (HOSPITAL_COMMUNITY): Payer: Self-pay | Admitting: Emergency Medicine

## 2013-07-16 DIAGNOSIS — E669 Obesity, unspecified: Secondary | ICD-10-CM | POA: Insufficient documentation

## 2013-07-16 DIAGNOSIS — I10 Essential (primary) hypertension: Secondary | ICD-10-CM | POA: Insufficient documentation

## 2013-07-16 DIAGNOSIS — E119 Type 2 diabetes mellitus without complications: Secondary | ICD-10-CM | POA: Insufficient documentation

## 2013-07-16 DIAGNOSIS — J111 Influenza due to unidentified influenza virus with other respiratory manifestations: Secondary | ICD-10-CM | POA: Insufficient documentation

## 2013-07-16 LAB — I-STAT TROPONIN, ED: TROPONIN I, POC: 0 ng/mL (ref 0.00–0.08)

## 2013-07-16 LAB — CBC
HEMATOCRIT: 36.2 % (ref 36.0–46.0)
Hemoglobin: 11.9 g/dL — ABNORMAL LOW (ref 12.0–15.0)
MCH: 23.9 pg — ABNORMAL LOW (ref 26.0–34.0)
MCHC: 32.9 g/dL (ref 30.0–36.0)
MCV: 72.7 fL — ABNORMAL LOW (ref 78.0–100.0)
PLATELETS: 139 10*3/uL — AB (ref 150–400)
RBC: 4.98 MIL/uL (ref 3.87–5.11)
RDW: 14.9 % (ref 11.5–15.5)
WBC: 8.7 10*3/uL (ref 4.0–10.5)

## 2013-07-16 LAB — BASIC METABOLIC PANEL
BUN: 6 mg/dL (ref 6–23)
CHLORIDE: 98 meq/L (ref 96–112)
CO2: 25 meq/L (ref 19–32)
Calcium: 9.6 mg/dL (ref 8.4–10.5)
Creatinine, Ser: 0.59 mg/dL (ref 0.50–1.10)
GFR calc Af Amer: >90 mL/min (ref >90)
GFR calc non Af Amer: >90 mL/min (ref >90)
Glucose, Bld: 364 mg/dL — ABNORMAL HIGH (ref 70–99)
POTASSIUM: 3.4 meq/L — AB (ref 3.7–5.3)
Sodium: 138 mEq/L (ref 137–147)

## 2013-07-16 LAB — PRO B NATRIURETIC PEPTIDE

## 2013-07-16 NOTE — ED Notes (Signed)
Pt c/o body aches, vomiting, sob, chest pain, cough for past several days. A&Ox4, resp e/u

## 2013-07-16 NOTE — ED Notes (Addendum)
Pt instructed NF that she has been here several hours and is leaving. RN encouraged patient to stay, pt refused politely. RN instructed patient to come back if her symptoms continues of gets worse. Pt agreed too.

## 2013-07-27 ENCOUNTER — Encounter (HOSPITAL_COMMUNITY): Payer: Self-pay | Admitting: Emergency Medicine

## 2013-07-27 ENCOUNTER — Emergency Department (HOSPITAL_COMMUNITY)
Admission: EM | Admit: 2013-07-27 | Discharge: 2013-07-27 | Disposition: A | Discharge status: Home / Self Care | Payer: Medicaid Other | Attending: Emergency Medicine | Admitting: Emergency Medicine

## 2013-07-27 DIAGNOSIS — E119 Type 2 diabetes mellitus without complications: Secondary | ICD-10-CM | POA: Insufficient documentation

## 2013-07-27 DIAGNOSIS — R1030 Lower abdominal pain, unspecified: Secondary | ICD-10-CM

## 2013-07-27 DIAGNOSIS — Z3202 Encounter for pregnancy test, result negative: Secondary | ICD-10-CM | POA: Insufficient documentation

## 2013-07-27 DIAGNOSIS — R112 Nausea with vomiting, unspecified: Secondary | ICD-10-CM | POA: Insufficient documentation

## 2013-07-27 DIAGNOSIS — R1032 Left lower quadrant pain: Secondary | ICD-10-CM | POA: Insufficient documentation

## 2013-07-27 DIAGNOSIS — R739 Hyperglycemia, unspecified: Secondary | ICD-10-CM

## 2013-07-27 DIAGNOSIS — Z79899 Other long term (current) drug therapy: Secondary | ICD-10-CM | POA: Insufficient documentation

## 2013-07-27 DIAGNOSIS — I10 Essential (primary) hypertension: Secondary | ICD-10-CM | POA: Insufficient documentation

## 2013-07-27 DIAGNOSIS — E669 Obesity, unspecified: Secondary | ICD-10-CM | POA: Insufficient documentation

## 2013-07-27 DIAGNOSIS — R197 Diarrhea, unspecified: Secondary | ICD-10-CM | POA: Insufficient documentation

## 2013-07-27 DIAGNOSIS — R1031 Right lower quadrant pain: Secondary | ICD-10-CM | POA: Insufficient documentation

## 2013-07-27 LAB — CBC
HEMATOCRIT: 37.1 % (ref 36.0–46.0)
HEMOGLOBIN: 11.9 g/dL — AB (ref 12.0–15.0)
MCH: 23.5 pg — AB (ref 26.0–34.0)
MCHC: 32.1 g/dL (ref 30.0–36.0)
MCV: 73.2 fL — ABNORMAL LOW (ref 78.0–100.0)
Platelets: 161 10*3/uL (ref 150–400)
RBC: 5.07 MIL/uL (ref 3.87–5.11)
RDW: 15.3 % (ref 11.5–15.5)
WBC: 9.3 10*3/uL (ref 4.0–10.5)

## 2013-07-27 LAB — COMPREHENSIVE METABOLIC PANEL
ALK PHOS: 59 U/L (ref 39–117)
ALT: 25 U/L (ref 0–35)
AST: 17 U/L (ref 0–37)
Albumin: 3.8 g/dL (ref 3.5–5.2)
BUN: 7 mg/dL (ref 6–23)
CALCIUM: 9.6 mg/dL (ref 8.4–10.5)
CO2: 27 mEq/L (ref 19–32)
Chloride: 100 mEq/L (ref 96–112)
Creatinine, Ser: 0.52 mg/dL (ref 0.50–1.10)
GFR calc non Af Amer: >90 mL/min (ref >90)
GLUCOSE: 363 mg/dL — AB (ref 70–99)
POTASSIUM: 4 meq/L (ref 3.7–5.3)
Sodium: 140 mEq/L (ref 137–147)
TOTAL PROTEIN: 7.9 g/dL (ref 6.0–8.3)
Total Bilirubin: 0.3 mg/dL (ref 0.3–1.2)

## 2013-07-27 LAB — URINE MICROSCOPIC-ADD ON

## 2013-07-27 LAB — URINALYSIS, ROUTINE W REFLEX MICROSCOPIC
Bilirubin Urine: NEGATIVE
Glucose, UA: >1000 mg/dL — AB
Hgb urine dipstick: NEGATIVE
KETONES UR: NEGATIVE mg/dL
LEUKOCYTES UA: NEGATIVE
NITRITE: NEGATIVE
PROTEIN: 30 mg/dL — AB
Specific Gravity, Urine: 1.043 — ABNORMAL HIGH (ref 1.005–1.030)
Urobilinogen, UA: 0.2 mg/dL (ref 0.0–1.0)
pH: 5.5 (ref 5.0–8.0)

## 2013-07-27 LAB — POC URINE PREG, ED: Preg Test, Ur: NEGATIVE

## 2013-07-27 MED ORDER — SODIUM CHLORIDE 0.9 % IV BOLUS (SEPSIS)
1000.0000 mL | Freq: Once | INTRAVENOUS | Status: AC
  Administered 2013-07-27: 1000 mL via INTRAVENOUS

## 2013-07-27 MED ORDER — DICYCLOMINE HCL 20 MG PO TABS: 20.0000 mg | ORAL_TABLET | Freq: Three times a day (TID) | ORAL | Status: DC

## 2013-07-27 MED ORDER — ONDANSETRON 4 MG PO TBDP
4.0000 mg | ORAL_TABLET | Freq: Once | ORAL | Status: AC
  Administered 2013-07-27: 4 mg via ORAL
  Filled 2013-07-27: qty 1

## 2013-07-27 MED ORDER — SIMETHICONE 40 MG/0.6ML PO SUSP
40.0000 mg | Freq: Once | ORAL | Status: AC
  Administered 2013-07-27: 40 mg via ORAL
  Filled 2013-07-27: qty 0.6

## 2013-07-27 MED ORDER — METFORMIN HCL 1000 MG PO TABS: 1000.0000 mg | ORAL_TABLET | Freq: Two times a day (BID) | ORAL | Status: AC

## 2013-07-27 MED ORDER — MORPHINE SULFATE 4 MG/ML IJ SOLN
4.0000 mg | Freq: Once | INTRAMUSCULAR | Status: AC
  Administered 2013-07-27: 4 mg via INTRAVENOUS
  Filled 2013-07-27: qty 1

## 2013-07-27 NOTE — ED Provider Notes (Signed)
CSN: [id number]     Arrival date & time 07/27/13  1033 History   First MD Initiated Contact with Patient 07/27/13 1134     Chief Complaint  Patient presents with  . Abdominal Pain     (Consider location/radiation/quality/duration/timing/severity/associated sxs/prior Treatment) HPI Comments: Sandra House is a 23 year-old female with a past medical history of HTN, Obesity, DM, presenting the Emergency Department with a chief complaint of lower abdominal pain for 3 days.  The patient describes the discomfort as non-radiating, intermittent, cramping pain, rated at an 8/10.  She reports associated non-bloody, loose stools.  She also reports one episode of non-bloody emesis 3 days ago. Patient's last menstrual period was 07/20/2013.   The history is provided by the patient. No language interpreter was used.    Past Medical History  Diagnosis Date  . Hypertension   . Diabetes mellitus   . Obesity   . Thyroid disease   . Hypothyroidism    Past Surgical History  Procedure Laterality Date  . Wisdom tooth extraction    . Cesarean section  10/25/2011    Procedure: CESAREAN SECTION;  Surgeon: Lesly Dukes, MD;  Location: [facility];  Service: Gynecology;  Laterality: N/A;  Primary cesarean section of baby boy at 62 APGAR   Family History  Problem Relation Age of Onset  . Cancer Paternal Aunt   . Cancer Paternal Uncle   . Diabetes Paternal Grandmother   . Diabetes Paternal Grandfather   . Anesthesia problems Neg Hx    History  Substance Use Topics  . Smoking status: Never Smoker   . Smokeless tobacco: Never Used  . Alcohol Use: No   OB History   Grav Para Term Preterm Abortions TAB SAB Ect Mult Living   1 1 0 1 0 0 0 0 0 1      Review of Systems  Constitutional: Negative for fever and chills.  Gastrointestinal: Positive for nausea, vomiting, abdominal pain and diarrhea. Negative for constipation, blood in stool, abdominal distention and anal bleeding.  Genitourinary:  Negative for dysuria, urgency, frequency, hematuria, flank pain, vaginal bleeding, vaginal discharge and pelvic pain.  Skin: Negative for rash.      Allergies  Review of patient's allergies indicates no known allergies.  Home Medications   Current Outpatient Rx  Name  Route  Sig  Dispense  Refill  . albuterol (PROVENTIL HFA;VENTOLIN HFA) 108 (90 BASE) MCG/ACT inhaler   Inhalation   Inhale 2 puffs into the lungs every 4 (four) hours as needed for wheezing or shortness of breath.   1 Inhaler   3   . metFORMIN (GLUCOPHAGE) 1000 MG tablet   Oral   Take 1 tablet (1,000 mg total) by mouth 2 (two) times daily.   30 tablet   1   . dicyclomine (BENTYL) 20 MG tablet   Oral   Take 1 tablet (20 mg total) by mouth 3 (three) times daily before meals.   90 tablet   0   . metFORMIN (GLUCOPHAGE) 1000 MG tablet   Oral   Take 1 tablet (1,000 mg total) by mouth 2 (two) times daily.   60 tablet   0    BP 127/67  Pulse 99  Temp(Src) 98.3 F (36.8 C) (Oral)  Resp 18  Ht 5\' 2"  (1.575 m)  Wt 336 lb 4.8 oz (152.545 kg)  BMI 61.49 kg/m2  SpO2 100%  LMP 07/20/2013 Physical Exam  Nursing note and vitals reviewed. Constitutional: She is oriented to person, place, and  time. Vital signs are normal. She appears well-developed and well-nourished. No distress.  HENT:  Head: Normocephalic and atraumatic.  Eyes: Conjunctivae and EOM are normal. Pupils are equal, round, and reactive to light. No scleral icterus.  Neck: Neck supple.  Cardiovascular: Normal rate, regular rhythm and normal heart sounds.   No murmur heard. Pulmonary/Chest: Effort normal and breath sounds normal. She has no wheezes.  Abdominal: Soft. Normal appearance and bowel sounds are normal. There is tenderness in the right lower quadrant, suprapubic area and left lower quadrant. There is no rebound, no guarding and no CVA tenderness.  Abdominal exam limited by patient's body habitus.    Musculoskeletal: Normal range of  motion. She exhibits no edema.  Neurological: She is alert and oriented to person, place, and time.  Skin: Skin is warm and dry. No rash noted.  Psychiatric: She has a normal mood and affect.    ED Course  Procedures (including critical care time) Labs Review Labs Reviewed  URINALYSIS, ROUTINE W REFLEX MICROSCOPIC - Abnormal; Notable for the following:    Specific Gravity, Urine 1.043 (*)    Glucose, UA >1000 (*)    Protein, ur 30 (*)    All other components within normal limits  CBC - Abnormal; Notable for the following:    Hemoglobin 11.9 (*)    MCV 73.2 (*)    MCH 23.5 (*)    All other components within normal limits  COMPREHENSIVE METABOLIC PANEL - Abnormal; Notable for the following:    Glucose, Bld 363 (*)    All other components within normal limits  URINE MICROSCOPIC-ADD ON  POC URINE PREG, ED   Imaging Review No results found.   EKG Interpretation None      MDM   Final diagnoses:  Lower abdominal pain  Diarrhea  Hyperglycemia   Pt with cramping lower abdominal pain for 3 days with associated diarrhea. Minimal suprapubic tenderness on exam.  Possible IBS.  Doubt gastroenteritis given length of symptoms.  EMR shows similar visits in the past and was referred to GI for possible IBS. UA shows dehydration and elevated glucose, no infection.  Urine pregnancy negative. Re-eval pt reports morphine has partially relieved symptoms.  CBC without anemia or leukocytosis, CMP shows elevated glucose, consistent with previous values. Re-eval Pt reports she passed gas and that has helped the abdominal discomfort. And requesting to be discarded home. Discussed following up with GI and PCP for further evaluation for further treatment of her DM. Discussed lab results, and treatment plan with the patient. Return precautions given. Reports understanding and no other concerns at this time.  Patient is stable for discharge at this time.  Meds given in ED:  Medications  sodium  chloride 0.9 % bolus 1,000 mL (0 mLs Intravenous Stopped 07/27/13 1453)  ondansetron (ZOFRAN-ODT) disintegrating tablet 4 mg (4 mg Oral Given 07/27/13 1245)  morphine 4 MG/ML injection 4 mg (4 mg Intravenous Given 07/27/13 1244)  simethicone (MYLICON) 40 MG/0.6ML suspension 40 mg (40 mg Oral Given 07/27/13 1409)    Discharge Medication List as of 07/27/2013  3:43 PM    START taking these medications   Details  dicyclomine (BENTYL) 20 MG tablet Take 1 tablet (20 mg total) by mouth 3 (three) times daily before meals., Starting 07/27/2013, Until Discontinued, Print    !! metFORMIN (GLUCOPHAGE) 1000 MG tablet Take 1 tablet (1,000 mg total) by mouth 2 (two) times daily., Starting 07/27/2013, Until Discontinued, Print     !! - Potential duplicate medications found.  Please discuss with provider.        Clabe SealLauren M Kalei Mckillop, PA-C 07/28/13 2238

## 2013-07-27 NOTE — ED Notes (Signed)
Pt states she's been having sharp abdominal pain for 3 days.  Pt with headphones on listening to music and in NAD.

## 2013-07-27 NOTE — Discharge Instructions (Signed)
Call for a follow up appointment with a Family or Primary Care Provider for further evaluation of your hyperglycemia.  Call [facility] for further evaluation of your lower abdominal pain and diarrhea.  Monitor your blood sugar. Return if Symptoms worsen.   Take medication as prescribed.

## 2013-08-02 NOTE — ED Provider Notes (Signed)
Medical screening examination/treatment/procedure(s) were performed by non-physician practitioner and as supervising physician I was immediately available for consultation/collaboration.   EKG Interpretation None        Tamerra Merkley J. Santanna Whitford, MD 08/02/13 2117 

## 2013-08-12 ENCOUNTER — Emergency Department (HOSPITAL_COMMUNITY): Payer: Medicaid Other

## 2013-08-12 ENCOUNTER — Emergency Department (HOSPITAL_COMMUNITY)
Admission: EM | Admit: 2013-08-12 | Discharge: 2013-08-13 | Disposition: A | Discharge status: Home / Self Care | Payer: Medicaid Other | Attending: Emergency Medicine | Admitting: Emergency Medicine

## 2013-08-12 ENCOUNTER — Encounter (HOSPITAL_COMMUNITY): Payer: Self-pay | Admitting: Emergency Medicine

## 2013-08-12 DIAGNOSIS — K920 Hematemesis: Secondary | ICD-10-CM | POA: Insufficient documentation

## 2013-08-12 DIAGNOSIS — F419 Anxiety disorder, unspecified: Secondary | ICD-10-CM

## 2013-08-12 DIAGNOSIS — H53149 Visual discomfort, unspecified: Secondary | ICD-10-CM | POA: Insufficient documentation

## 2013-08-12 DIAGNOSIS — R198 Other specified symptoms and signs involving the digestive system and abdomen: Secondary | ICD-10-CM | POA: Insufficient documentation

## 2013-08-12 DIAGNOSIS — Z3202 Encounter for pregnancy test, result negative: Secondary | ICD-10-CM | POA: Insufficient documentation

## 2013-08-12 DIAGNOSIS — R630 Anorexia: Secondary | ICD-10-CM | POA: Insufficient documentation

## 2013-08-12 DIAGNOSIS — F410 Panic disorder [episodic paroxysmal anxiety] without agoraphobia: Secondary | ICD-10-CM | POA: Insufficient documentation

## 2013-08-12 DIAGNOSIS — R739 Hyperglycemia, unspecified: Secondary | ICD-10-CM

## 2013-08-12 DIAGNOSIS — R51 Headache: Secondary | ICD-10-CM | POA: Insufficient documentation

## 2013-08-12 DIAGNOSIS — Z79899 Other long term (current) drug therapy: Secondary | ICD-10-CM | POA: Insufficient documentation

## 2013-08-12 DIAGNOSIS — E119 Type 2 diabetes mellitus without complications: Secondary | ICD-10-CM | POA: Insufficient documentation

## 2013-08-12 DIAGNOSIS — I10 Essential (primary) hypertension: Secondary | ICD-10-CM | POA: Insufficient documentation

## 2013-08-12 LAB — BASIC METABOLIC PANEL
BUN: 7 mg/dL (ref 6–23)
CHLORIDE: 98 meq/L (ref 96–112)
CO2: 27 mEq/L (ref 19–32)
Calcium: 9.7 mg/dL (ref 8.4–10.5)
Creatinine, Ser: 0.47 mg/dL — ABNORMAL LOW (ref 0.50–1.10)
GFR calc non Af Amer: >90 mL/min (ref >90)
Glucose, Bld: 316 mg/dL — ABNORMAL HIGH (ref 70–99)
Potassium: 4.4 mEq/L (ref 3.7–5.3)
Sodium: 138 mEq/L (ref 137–147)

## 2013-08-12 LAB — CBC
HEMATOCRIT: 37.7 % (ref 36.0–46.0)
Hemoglobin: 12.3 g/dL (ref 12.0–15.0)
MCH: 23.8 pg — ABNORMAL LOW (ref 26.0–34.0)
MCHC: 32.6 g/dL (ref 30.0–36.0)
MCV: 73.1 fL — ABNORMAL LOW (ref 78.0–100.0)
Platelets: 144 10*3/uL — ABNORMAL LOW (ref 150–400)
RBC: 5.16 MIL/uL — ABNORMAL HIGH (ref 3.87–5.11)
RDW: 15 % (ref 11.5–15.5)
WBC: 9.6 10*3/uL (ref 4.0–10.5)

## 2013-08-12 LAB — POC URINE PREG, ED: Preg Test, Ur: NEGATIVE

## 2013-08-12 LAB — I-STAT TROPONIN, ED: TROPONIN I, POC: 0 ng/mL (ref 0.00–0.08)

## 2013-08-12 LAB — POC OCCULT BLOOD, ED: Fecal Occult Bld: POSITIVE — AB

## 2013-08-12 LAB — CBG MONITORING, ED: Glucose-Capillary: 241 mg/dL — ABNORMAL HIGH (ref 70–99)

## 2013-08-12 MED ORDER — DIAZEPAM 5 MG PO TABS: 5.0000 mg | ORAL_TABLET | Freq: Two times a day (BID) | ORAL | Status: DC | PRN

## 2013-08-12 MED ORDER — BUTALBITAL-APAP-CAFFEINE 50-325-40 MG PO TABS: 1.0000 | ORAL_TABLET | Freq: Four times a day (QID) | ORAL | Status: DC | PRN

## 2013-08-12 MED ORDER — INSULIN ASPART 100 UNIT/ML ~~LOC~~ SOLN: 10.0000 [IU] | Freq: Once | SUBCUTANEOUS | Status: DC

## 2013-08-12 MED ORDER — LORAZEPAM 2 MG/ML IJ SOLN
1.0000 mg | Freq: Once | INTRAMUSCULAR | Status: AC
  Administered 2013-08-12: 1 mg via INTRAVENOUS
  Filled 2013-08-12: qty 1

## 2013-08-12 MED ORDER — SODIUM CHLORIDE 0.9 % IV BOLUS (SEPSIS)
1000.0000 mL | Freq: Once | INTRAVENOUS | Status: AC
  Administered 2013-08-12: 1000 mL via INTRAVENOUS

## 2013-08-12 MED ORDER — SUCRALFATE 1 G PO TABS: 1.0000 g | ORAL_TABLET | Freq: Two times a day (BID) | ORAL | Status: DC

## 2013-08-12 NOTE — ED Notes (Addendum)
Per ems: pt called for anxiety. On arrival to scene, pt c/o chest pain and headache. When cardiac monitor was applied, they found pt with PACs/possible a-fib. VSS, a&ox4. After further evaluation, ems found the pt had CP for past 2 days, sharp and substernal, unrelieved by otc meds. ems administered 324 mg asa

## 2013-08-12 NOTE — Discharge Instructions (Signed)
Please use resources below to find a primary care provider and a psychiatrist for further evaluation and management of your medical condition.  Take valium as needed for panic attack.  Take sucrafate to decrease risk of vomiting blood.  Follow up with [facility] for further care.  Return if you have any concerns.    Panic Attacks Panic attacks are sudden, short-livedsurges of severe anxiety, fear, or discomfort. They may occur for no reason when you are relaxed, when you are anxious, or when you are sleeping. Panic attacks may occur for a number of reasons:   Healthy people occasionally have panic attacks in extreme, life-threatening situations, such as war or natural disasters. Normal anxiety is a protective mechanism of the body that helps [place] react to danger (fight or flight response).  Panic attacks are often seen with anxiety disorders, such as panic disorder, social anxiety disorder, generalized anxiety disorder, and phobias. Anxiety disorders cause excessive or uncontrollable anxiety. They may interfere with your relationships or other life activities.  Panic attacks are sometimes seen with other mental illnesses such as depression and posttraumatic stress disorder.  Certain medical conditions, prescription medicines, and drugs of abuse can cause panic attacks. SYMPTOMS  Panic attacks start suddenly, peak within 20 minutes, and are accompanied by four or more of the following symptoms:  Pounding heart or fast heart rate (palpitations).  Sweating.  Trembling or shaking.  Shortness of breath or feeling smothered.  Feeling choked.  Chest pain or discomfort.  Nausea or strange feeling in your stomach.  Dizziness, lightheadedness, or feeling like you will faint.  Chills or hot flushes.  Numbness or tingling in your lips or hands and feet.  Feeling that things are not real or feeling that you are not yourself.  Fear of losing control or going crazy.  Fear of dying. Some  of these symptoms can mimic serious medical conditions. For example, you may think you are having a heart attack. Although panic attacks can be very scary, they are not life threatening. DIAGNOSIS  Panic attacks are diagnosed through an assessment by your health care provider. Your health care provider will ask questions about your symptoms, such as where and when they occurred. Your health care provider will also ask about your medical history and use of alcohol and drugs, including prescription medicines. Your health care provider may order blood tests or other studies to rule out a serious medical condition. Your health care provider may refer you to a mental health professional for further evaluation. TREATMENT   Most healthy people who have one or two panic attacks in an extreme, life-threatening situation will not require treatment.  The treatment for panic attacks associated with anxiety disorders or other mental illness typically involves counseling with a mental health professional, medicine, or a combination of both. Your health care provider will help determine what treatment is best for you.  Panic attacks due to physical illness usually goes away with treatment of the illness. If prescription medicine is causing panic attacks, talk with your health care provider about stopping the medicine, decreasing the dose, or substituting another medicine.  Panic attacks due to alcohol or drug abuse goes away with abstinence. Some adults need professional help in order to stop drinking or using drugs. HOME CARE INSTRUCTIONS   Take all your medicines as prescribed.   Check with your health care provider before starting new prescription or over-the-counter medicines.  Keep all follow up appointments with your health care provider. SEEK MEDICAL CARE IF:  You are not able to take your medicines as prescribed.  Your symptoms do not improve or get worse. SEEK IMMEDIATE MEDICAL CARE IF:   You  experience panic attack symptoms that are different than your usual symptoms.  You have serious thoughts about hurting yourself or others.  You are taking medicine for panic attacks and have a serious side effect. MAKE SURE YOU:  Understand these instructions.  Will watch your condition.  Will get help right away if you are not doing well or get worse. Document Released: 05/12/2005 Document Revised: 03/02/2013 Document Reviewed: 12/24/2012 [facility] Patient Information 2014 [place], [place].   Emergency Department Resource Guide 1) Find a Doctor and Pay Out of Pocket Although you won't have to find out who is covered by your insurance plan, it is a good idea to ask around and get recommendations. You will then need to call the office and see if the doctor you have chosen will accept you as a new patient and what types of options they offer for patients who are self-pay. Some doctors offer discounts or will set up payment plans for their patients who do not have insurance, but you will need to ask so you aren't surprised when you get to your appointment.  2) Contact Your Local Health Department Not all health departments have doctors that can see patients for sick visits, but many do, so it is worth a call to see if yours does. If you don't know where your local health department is, you can check in your phone book. The CDC also has a tool to help you locate your state's health department, and many state websites also have listings of all of their local health departments.  3) Find a Walk-in Clinic If your illness is not likely to be very severe or complicated, you may want to try a walk in clinic. These are popping up all over the country in pharmacies, drugstores, and shopping centers. They're usually staffed by nurse practitioners or physician assistants that have been trained to treat common illnesses and complaints. They're usually fairly quick and inexpensive. However, if you have serious  medical issues or chronic medical problems, these are probably not your best option.  No Primary Care Doctor: - Call Health Connect at  [phone] - they can help you locate a primary care doctor that  accepts your insurance, provides certain services, etc. - Physician Referral Service- [phone]  Chronic Pain Problems: Organization         Address  Phone   Notes  [facility]  [phone] Patients need to be referred by their primary care doctor.   Medication Assistance: Organization         Address  Phone   Notes  [facility] Medication [facility] [address] [place], [place] [address] [phone] --Must be a resident of [facility] -- Must have NO insurance coverage whatsoever (no Medicaid/ Medicare, etc.) -- The pt. MUST have a primary care doctor that directs their care regularly and follows them in the community   [facility]  [phone]   [employer]  [phone]    Agencies that provide inexpensive medical care: Organization         Address  Phone   Notes  [facility]  [phone]   [facility]    [phone]   [facility] [address] [place], [place] [address] [phone]   [facility] [place] [address], [place] [phone]   Planned Parenthood    [phone]   [facility]    [phone]   [facility]  [address], [place] Phone:  [phone](336) [phone], Fax:  [phone] Hours of Operation:  9 am - 6 pm, M-F.  Also accepts Medicaid/Medicare and self-pay.  [facility]  [address], [place] Phone: [phone], Fax: [phone]) [phone]. Hours of Operation:  8:30 am - 5:30 pm, M-F.  Also accepts Medicaid and self-pay.  [facility] [place] [address], [place] Phone: [phone]) [phone]   [facility] [address], [place], [place] [phone], Ext. [phone] Mondays & Thursdays: 7-9 AM.  First 15 patients are seen on a first come, first serve basis.    Medicaid-accepting [facility] Providers:  Organization         Address  Phone   Notes  [facility] [address], [place] [phone] Also accepts self-pay patients.  [facility] [address], [place]  [phone]) [phone]   [facility] [address], [place] [phone]   [facility] [address] [place] [address], [place] [phone](336) [phone]   Renaye RakersVeita Bland [address], [place]   [phone] Only accepts [place] Access [place] patients after they have their name applied to their card.   Self-Pay (no insurance) in [facility]:  Organization         Address  Phone   Notes  Sickle Cell Patients, [facility] [address], [place] [phone]) [phone]   [facility] [address], [place] [phone]) [phone]   [facility] [place]  [address], [place] [phone]   [facility]/Dr. Osei-Bonsu  [address] [place] [address], [place] or [phone], [place] [phone] Phone number for both [place] and [place] locations is the same.  [facility] [address], [place] [phone]) [phone]   [facility]  [address] [place] [address], [place] or [address] [phone]) [phone]   [facility] [address], [place] [phone]) [phone], phone; [phone], fax Sees patients 1st and 3rd Saturday of every month.  Must not qualify for public or private insurance (i.e. Medicaid, Medicare, [place] Health Choice, Veterans' Benefits)  Household income should be no more than 200% of the poverty level The clinic cannot treat you if you are pregnant or think you are pregnant  Sexually  transmitted diseases are not treated at the clinic.    Dental Care: Organization         Address  Phone  Notes  [facility] [address], [place] [phone]) [phone] Accepts children up to age 23 who are enrolled in [place] or [place] Health Choice; pregnant women with a Medicaid card; and children who have applied for Medicaid or [place] Health Choice, but were declined, whose parents can pay a reduced fee at time of service.  [facility] [place]  [address], [place] [phone] Accepts children up to age 23 who are enrolled in [place] or [place] Health Choice; pregnant women with a Medicaid card; and children  who have applied for Medicaid or [place] Health Choice, but were declined, whose parents can pay a reduced fee at time of service.  [facility]  [address] [place], [place] [phone] Patients are seen by appointment only. Walk-ins are not accepted. [facility] will see patients 82 years of age and older. Monday - Tuesday (8am-5pm) Most Wednesdays (8:30-5pm) $30 per visit, cash only  [facility] Adult [facility] Access PRO[facility]  [address], [facility] [phone] Patients are seen by appointment only. Walk-ins are not accepted. [facility] will see patients 69 years of age and older. One Wednesday Evening (Monthly: Volunteer Based).  $30 per visit, cash only  [employer]  [phone] for adults; Children under age 53, call Graduate Pediatric Dentistry at [phone]. Children aged 68-14, please call [phone] to request a pediatric application.  Dental services are provided in all areas of dental care including fillings, crowns and bridges, complete and partial dentures, implants, gum treatment, root canals, and extractions. Preventive care is also provided. Treatment is provided to both adults and children. Patients are  selected via a lottery and there is often a waiting list.   [facility] [address], [place]  [phone] [email or web address]   [facility] [address] [place], [place] [phone], Ext. [phone] Second and Fourth Thursday of each month, opens at 6:30 AM; Clinic ends at 9 AM.  Patients are seen on a first-come first-served basis, and a limited number are seen during each clinic.   [facility]  [address] [place], [place] [phone]   Eligibility Requirements You must have lived in [place], [place], or [place] counties for at least the last three months.   You cannot be eligible for state or federal sponsored [place], including [employer], [place], or [employer].   You generally cannot be eligible for healthcare insurance through your employer.    How to apply: Eligibility screenings are held every Tuesday and Wednesday afternoon from 1:00 pm until 4:00 pm. You do not need an appointment for the interview!  [facility] [address] [place], [place] [phone]   [facility] Health Department  [phone]   [facility] Health Department  [phone]   [facility]  [phone]    Behavioral Health Resources in the Community: Intensive Outpatient Programs Organization         Address  Phone  Notes  [facility] Services [address], [place], [place] [phone]   [facility] Outpatient [address], [place], [place] [phone]   [facility] [address] [place], [place]  [phone]   [facility] [address],  [place], [place] [phone] or [phone]   Substance Abuse Resources Organization         Address  Phone  Notes  [facility] Drug Services  [phone]   [facility]  [phone]   [facility]  [phone]   Floydene Flock  [phone]     Residential & Outpatient Substance Abuse Program  [phone]   Psychological Services Organization         Address  Phone  Notes  [facility]  [phone]   [facility] Services  [phone]   [facility] [address] [place] [phone] or [phone]    Mobile Crisis Teams Organization  Address  Phone  Notes  [facility], Mobile Crisis Care Unit  [phone]   [facility]  [address] [place], [place] [phone]   [facility] [address] [place] [phone]    Self-Help/Support Groups Organization         Address  Phone             Notes  [facility] of [place] - variety of support groups  [phone]- I7437963 Call for more information  Narcotics Anonymous (NA), [facility] [address], [employer] [place]  2 meetings at this location   Statistician         Address  Phone  Notes  [facility] [address],    [place]  [phone]   [facility]  [address] [place] [address], [place], [place] [phone]   [facility] [address] [place], [place] [phone] Admissions: 8am-3pm M-F  [facility] [address]    [place], [place] [phone]   [facility] [address] [place], [place], [place] [phone]   [facility] [address],  [place], [place] [phone]   [facility] - Intensive Outpatient [address], [place], [place] [phone]   [facility] ([facility] [address]  [place], [place] [phone] or [phone]   [facility] ([facility]) [address] [place], [place] [phone] Accepts Medicaid  [facility] [address],  [place] [phone] Substance Abuse/Addiction Treatment   [facility] Organization         Address  Phone  Notes  [facility]  [phone]   Angie Fava, PhD [address]vin Kn[address] [place], [place]   [phone] or [phone]   [facility] Behavioral   [address] [place], [place] [phone]   [facility] [address], [place], [place] [phone] Insurance/Medicaid/sponsorship through [facility] and Families [address]                                    [place], [place] [phone] Therapy/tele-psych/case  [facility] [address], [place] [phone]    Dr. Lolly Mustache  [phone]   [facility] [place]  [employer] [facility] Dept. 1) [address], [place] 2) [address] [place] [address] [place] 3)  [address], [place] [phone]  [phone]   [facility] Child Abuse Hotline (724)043-4773 or [phone] (After Hours)

## 2013-08-12 NOTE — ED Provider Notes (Signed)
CSN: [id number]     Arrival date & time 08/12/13  1535 History   First MD Initiated Contact with Patient 08/12/13 2027     Chief Complaint  Patient presents with  . Anxiety     (Consider location/radiation/quality/duration/timing/severity/associated sxs/prior Treatment) HPI Comments: Patient is a 23 year old female who presents complaining of a panic attack with headache, chest pain, and vomiting blood. She has not eaten today and feels lightheaded. At the time of her panic attack, she tried a generic brand pain reliever for the headache and chest pain. This did not improve her symptoms.  The headache is described as a gradual onset of constant sharp, stabbing, 9/10 pain localized to the right side of her head and forehead x 3 days.. Patient reports photophobia and phonophobia. The headache is still present.  The chest pain is described as a sudden onset of constant tight, pulling sensation ranked 7/10. The pain is localized to the left side of her chest without radiation. Patient endorses chest tightness, palpitations, shortness of breath, and tenderness to palpation. Patient is not taking birth control pills and denies cough, recent travel, leg pain, erythema or swelling.   Patient states she takes metformin as needed for diabetes. She has not been diagnosed with anxiety and does not take medication to relieve her symptoms. She had about 7 panic attacks in the past 2 weeks with headache and chest pain. Patient denies current SI but has had SI earlier with a plan but will not disclose it. Denies HI, AH, VH.   Patient is a 23 y.o. female presenting with anxiety. The history is provided by the patient. No language interpreter was used.  Anxiety This is a recurrent problem. The current episode started today. The problem occurs every several days. The problem has been unchanged. Associated symptoms include abdominal pain, anorexia, a change in bowel habit, chest pain, headaches, a visual change and  vomiting. Pertinent negatives include no chills, fever, sore throat or urinary symptoms.    Past Medical History  Diagnosis Date  . Hypertension   . Diabetes mellitus   . Obesity   . Thyroid disease   . Hypothyroidism    Past Surgical History  Procedure Laterality Date  . Wisdom tooth extraction    . Cesarean section  10/25/2011    Procedure: CESAREAN SECTION;  Surgeon: Lesly DukesKelly H Leggett, MD;  Location: [facility];  Service: Gynecology;  Laterality: N/A;  Primary cesarean section of baby boy at 201941 APGAR   Family History  Problem Relation Age of Onset  . Cancer Paternal Aunt   . Cancer Paternal Uncle   . Diabetes Paternal Grandmother   . Diabetes Paternal Grandfather   . Anesthesia problems Neg Hx    History  Substance Use Topics  . Smoking status: Never Smoker   . Smokeless tobacco: Never Used  . Alcohol Use: No   OB History   Grav Para Term Preterm Abortions TAB SAB Ect Mult Living   1 1 0 1 0 0 0 0 0 1      Review of Systems  Constitutional: Negative for fever and chills.  HENT: Negative for sore throat.   Cardiovascular: Positive for chest pain.  Gastrointestinal: Positive for vomiting, abdominal pain, anorexia and change in bowel habit.  Neurological: Positive for headaches.      Allergies  Review of patient's allergies indicates no known allergies.  Home Medications   Current Outpatient Rx  Name  Route  Sig  Dispense  Refill  . acetaminophen (  TYLENOL) 500 MG tablet   Oral   Take 1,000 mg by mouth every 6 (six) hours as needed (pain).         Marland Kitchen albuterol (PROVENTIL HFA;VENTOLIN HFA) 108 (90 BASE) MCG/ACT inhaler   Inhalation   Inhale 2 puffs into the lungs every 6 (six) hours as needed for wheezing or shortness of breath.         . metFORMIN (GLUCOPHAGE) 1000 MG tablet   Oral   Take 1 tablet (1,000 mg total) by mouth 2 (two) times daily.   60 tablet   0    BP 119/70  Pulse 96  Temp(Src) 98.5 F (36.9 C) (Oral)  Resp 23  Ht 5\' 2"  (1.575 m)   Wt 336 lb (152.409 kg)  BMI 61.44 kg/m2  SpO2 98%  LMP 07/18/2013 Physical Exam  Nursing note and vitals reviewed. Constitutional: She is oriented to person, place, and time. She appears well-developed and well-nourished. No distress.  Morbidly obese.  Awake, alert, nontoxic appearance  HENT:  Head: Atraumatic.  Mouth/Throat: Oropharynx is clear and moist.  Eyes: Conjunctivae are normal. Right eye exhibits no discharge. Left eye exhibits no discharge.  Neck: Neck supple. No JVD present.  No nuchal rigidity  Cardiovascular: Normal rate and regular rhythm.   Pulmonary/Chest: Effort normal. No respiratory distress. She exhibits no tenderness (mild midsternal tenderness without crepitus or emphysema.  no rash).  Abdominal: Soft. There is no tenderness. There is no rebound.  Genitourinary:  Chaperone present:  Normal rectal tone, normal stool color, no mass.  Hemoccult positive  Musculoskeletal: She exhibits no tenderness.  ROM appears intact, no obvious focal weakness  Lymphadenopathy:    She has no cervical adenopathy.  Neurological: She is alert and oriented to person, place, and time. GCS eye subscore is 4. GCS verbal subscore is 5. GCS motor subscore is 6.  Mental status and motor strength appears intact  Skin: No rash noted.  Psychiatric: She has a normal mood and affect. Her speech is normal and behavior is normal. Thought content is not paranoid. She expresses no homicidal and no suicidal ideation.    ED Course  Procedures (including critical care time)  10:45 PM Pt report panic attack, has a hx of it.  Fleeting suicidal thoughts but no active SI/HI/Hallucination.  Feeling OK right now.  Report having headache x 3 days. No sudden onset severe headache concerning for SAH, no focal neuro deficit concerning stroke or mass, no fever or nuchal rigidity concerning for meningitis. Report vomit up small amount of blood once today.  Hemoccult positive.  Does have some mild epigastric  tenderness on exam but denies alcohol abuse or NSAIDs use.  Will give GI referral. Normal orthostatic vital sign and hemodynamically stable.  Elevated CBG of 316, non compliant with her diabetic medication.  Will give IVF and insulin.  Recommend taking metformin as prescribed. Will give ativan for anxiety.  Will also give resources for outpatient follow up.    Labs Review Labs Reviewed  CBC - Abnormal; Notable for the following:    RBC 5.16 (*)    MCV 73.1 (*)    MCH 23.8 (*)    Platelets 144 (*)    All other components within normal limits  BASIC METABOLIC PANEL - Abnormal; Notable for the following:    Glucose, Bld 316 (*)    Creatinine, Ser 0.47 (*)    All other components within normal limits  POC OCCULT BLOOD, ED - Abnormal; Notable for the following:  Fecal Occult Bld POSITIVE (*)    All other components within normal limits  CBG MONITORING, ED - Abnormal; Notable for the following:    Glucose-Capillary 241 (*)    All other components within normal limits  I-STAT TROPOININ, ED  POC URINE PREG, ED   Imaging Review Dg Chest 2 View  08/12/2013   CLINICAL DATA:  Fever, anxiety, asthma, diabetes, hypertension, smoker  EXAM: CHEST  2 VIEW  COMPARISON:  07/16/2013  FINDINGS: Borderline enlargement of cardiac silhouette.  Mediastinal contours and pulmonary vascularity normal.  Lungs clear.  No pleural effusion or pneumothorax.  Bones unremarkable.  IMPRESSION: No acute abnormalities.   Electronically Signed   By: Ulyses Southward M.D.   On: 08/12/2013 18:34     EKG Interpretation   Date/Time:  Friday August 12 2013 15:38:28 EDT Ventricular Rate:  83 PR Interval:  120 QRS Duration: 84 QT Interval:  368 QTC Calculation: 432 R Axis:   96 Text Interpretation:  Sinus rhythm with marked sinus arrhythmia with  junctional escape complexes Rightward axis Borderline ECG Confirmed by  HORTON  MD, COURTNEY ([id number]) on 08/12/2013 8:27:21 PM      MDM   Final diagnoses:  Anxiety   Hyperglycemia  Hematemesis    BP 119/70  Pulse 96  Temp(Src) 98.5 F (36.9 C) (Oral)  Resp 23  Ht 5\' 2"  (1.575 m)  Wt 336 lb (152.409 kg)  BMI 61.44 kg/m2  SpO2 98%  LMP 07/18/2013  I have reviewed nursing notes and vital signs. I personally reviewed the imaging tests through PACS system  I reviewed available ER/hospitalization records thought the EMR   Fayrene Helper, [place] 08/12/13 2337

## 2013-08-13 NOTE — ED Provider Notes (Signed)
Medical screening examination/treatment/procedure(s) were performed by non-physician practitioner and as supervising physician I was immediately available for consultation/collaboration.   EKG Interpretation   Date/Time:  Friday August 12 2013 15:38:28 EDT Ventricular Rate:  83 PR Interval:  120 QRS Duration: 84 QT Interval:  368 QTC Calculation: 432 R Axis:   96 Text Interpretation:  Sinus rhythm with marked sinus arrhythmia with  junctional escape complexes Rightward axis Borderline ECG Confirmed by  Wilkie AyeHORTON  MD, Emannuel Vise ([id number]) on 08/12/2013 8:27:21 PM        Shon Batonourtney F Odus Clasby, MD 08/13/13 [id number]

## 2013-09-13 ENCOUNTER — Emergency Department (HOSPITAL_COMMUNITY)
Admission: EM | Admit: 2013-09-13 | Discharge: 2013-09-14 | Disposition: A | Discharge status: Home / Self Care | Payer: Medicaid Other | Attending: Emergency Medicine | Admitting: Emergency Medicine

## 2013-09-13 DIAGNOSIS — E119 Type 2 diabetes mellitus without complications: Secondary | ICD-10-CM | POA: Insufficient documentation

## 2013-09-13 DIAGNOSIS — H6690 Otitis media, unspecified, unspecified ear: Secondary | ICD-10-CM | POA: Insufficient documentation

## 2013-09-13 DIAGNOSIS — H6090 Unspecified otitis externa, unspecified ear: Secondary | ICD-10-CM

## 2013-09-13 DIAGNOSIS — E669 Obesity, unspecified: Secondary | ICD-10-CM | POA: Insufficient documentation

## 2013-09-13 DIAGNOSIS — Z79899 Other long term (current) drug therapy: Secondary | ICD-10-CM | POA: Insufficient documentation

## 2013-09-13 DIAGNOSIS — I10 Essential (primary) hypertension: Secondary | ICD-10-CM | POA: Insufficient documentation

## 2013-09-13 DIAGNOSIS — H60399 Other infective otitis externa, unspecified ear: Secondary | ICD-10-CM | POA: Insufficient documentation

## 2013-09-13 DIAGNOSIS — R51 Headache: Secondary | ICD-10-CM | POA: Insufficient documentation

## 2013-09-13 NOTE — ED Notes (Signed)
Pt c/o right ear and right side facial pain. Pt A&Ox4, respirations equal and unlabored, skin warm and dry

## 2013-09-14 ENCOUNTER — Encounter (HOSPITAL_COMMUNITY): Payer: Self-pay | Admitting: Emergency Medicine

## 2013-09-14 MED ORDER — AMOXICILLIN-POT CLAVULANATE 875-125 MG PO TABS: 1.0000 | ORAL_TABLET | Freq: Two times a day (BID) | ORAL | Status: DC

## 2013-09-14 MED ORDER — OFLOXACIN 0.3 % OT SOLN: 3.0000 [drp] | Freq: Two times a day (BID) | OTIC | Status: AC

## 2013-09-14 NOTE — ED Notes (Signed)
Pt A&Ox4, ambulatory with slow, steady gait at discharge, verbalizing no complaints at this time. Pt given sprite and graham crackers.

## 2013-09-14 NOTE — Discharge Instructions (Signed)
Continue to use antibiotic drops  Start oral antibiotics and complete course for full dose  Return to the emergency department if you develop any changing/worsening condition, fever, or any other concerns (please read additional information regarding your condition below)     Otitis Media, Adult Otitis media is redness, soreness, and swelling (inflammation) of the middle ear. Otitis media may be caused by allergies or, most commonly, by infection. Often it occurs as a complication of the common cold. SIGNS AND SYMPTOMS Symptoms of otitis media may include:  Earache.  Fever.  Ringing in your ear.  Headache.  Leakage of fluid from the ear. DIAGNOSIS To diagnose otitis media, your health care provider will examine your ear with an otoscope. This is an instrument that allows your health care provider to see into your ear in order to examine your eardrum. Your health care provider also will ask you questions about your symptoms. TREATMENT  Typically, otitis media resolves on its own within 3 5 days. Your health care provider may prescribe medicine to ease your symptoms of pain. If otitis media does not resolve within 5 days or is recurrent, your health care provider may prescribe antibiotic medicines if he or she suspects that a bacterial infection is the cause. HOME CARE INSTRUCTIONS   Take your medicine as directed until it is gone, even if you feel better after the first few days.  Only take over-the-counter or prescription medicines for pain, discomfort, or fever as directed by your health care provider.  Follow up with your health care provider as directed. SEEK MEDICAL CARE IF:  You have otitis media only in one ear or bleeding from your nose or both.  You notice a lump on your neck.  You are not getting better in 3 5 days.  You feel worse instead of better. SEEK IMMEDIATE MEDICAL CARE IF:   You have pain that is not controlled with medicine.  You have swelling, redness,  or pain around your ear or stiffness in your neck.  You notice that part of your face is paralyzed.  You notice that the bone behind your ear (mastoid) is tender when you touch it. MAKE SURE YOU:   Understand these instructions.  Will watch your condition.  Will get help right away if you are not doing well or get worse. Document Released: 02/15/2004 Document Revised: 03/02/2013 Document Reviewed: 12/07/2012 [facility] Patient Information 2014 [place], [place].  Otitis Externa Otitis externa is a bacterial or fungal infection of the outer ear canal. This is the area from the eardrum to the outside of the ear. Otitis externa is sometimes called "swimmer's ear." CAUSES  Possible causes of infection include:  Swimming in dirty water.  Moisture remaining in the ear after swimming or bathing.  Mild injury (trauma) to the ear.  Objects stuck in the ear (foreign body).  Cuts or scrapes (abrasions) on the outside of the ear. SYMPTOMS  The first symptom of infection is often itching in the ear canal. Later signs and symptoms may include swelling and redness of the ear canal, ear pain, and yellowish-white fluid (pus) coming from the ear. The ear pain may be worse when pulling on the earlobe. DIAGNOSIS  Your caregiver will perform a physical exam. A sample of fluid may be taken from the ear and examined for bacteria or fungi. TREATMENT  Antibiotic ear drops are often given for 10 to 14 days. Treatment may also include pain medicine or corticosteroids to reduce itching and swelling. PREVENTION  Keep your ear dry. Use the corner of a towel to absorb water out of the ear canal after swimming or bathing.  Avoid scratching or putting objects inside your ear. This can damage the ear canal or remove the protective wax that lines the canal. This makes it easier for bacteria and fungi to grow.  Avoid swimming in lakes, polluted water, or poorly chlorinated pools.  You may use ear drops made of  rubbing alcohol and vinegar after swimming. Combine equal parts of white vinegar and alcohol in a bottle. Put 3 or 4 drops into each ear after swimming. HOME CARE INSTRUCTIONS   Apply antibiotic ear drops to the ear canal as prescribed by your caregiver.  Only take over-the-counter or prescription medicines for pain, discomfort, or fever as directed by your caregiver.  If you have diabetes, follow any additional treatment instructions from your caregiver.  Keep all follow-up appointments as directed by your caregiver. SEEK MEDICAL CARE IF:   You have a fever.  Your ear is still red, swollen, painful, or draining pus after 3 days.  Your redness, swelling, or pain gets worse.  You have a severe headache.  You have redness, swelling, pain, or tenderness in the area behind your ear. MAKE SURE YOU:   Understand these instructions.  Will watch your condition.  Will get help right away if you are not doing well or get worse. Document Released: 05/12/2005 Document Revised: 08/04/2011 Document Reviewed: 05/29/2011 [facility] Patient Information 2014 [place], [place].   Emergency Department Resource Guide 1) Find a Doctor and Pay Out of Pocket Although you won't have to find out who is covered by your insurance plan, it is a good idea to ask around and get recommendations. You will then need to call the office and see if the doctor you have chosen will accept you as a new patient and what types of options they offer for patients who are self-pay. Some doctors offer discounts or will set up payment plans for their patients who do not have insurance, but you will need to ask so you aren't surprised when you get to your appointment.  2) Contact Your Local Health Department Not all health departments have doctors that can see patients for sick visits, but many do, so it is worth a call to see if yours does. If you don't know where your local health department is, you can check in your phone book.  The CDC also has a tool to help you locate your state's health department, and many state websites also have listings of all of their local health departments.  3) Find a Walk-in Clinic If your illness is not likely to be very severe or complicated, you may want to try a walk in clinic. These are popping up all over the country in pharmacies, drugstores, and shopping centers. They're usually staffed by nurse practitioners or physician assistants that have been trained to treat common illnesses and complaints. They're usually fairly quick and inexpensive. However, if you have serious medical issues or chronic medical problems, these are probably not your best option.  No Primary Care Doctor: - Call Health Connect at  [phone] - they can help you locate a primary care doctor that  accepts your insurance, provides certain services, etc. - Physician Referral Service- [phone]  Chronic Pain Problems: Organization         Address  Phone   Notes  [facility]  [phone] Patients need to be referred by their  primary care doctor.   Medication Assistance: Organization         Address  Phone   Notes  [facility] Medication [facility] [address] [place], [place] [address] [phone] --Must be a resident of [facility] -- Must have NO insurance coverage whatsoever (no Medicaid/ Medicare, etc.) -- The pt. MUST have a primary care doctor that directs their care regularly and follows them in the community   MedAssist  [phone]   [employer]  [phone]    Agencies that provide inexpensive medical care: Organization         Address  Phone   Notes  [facility]  [phone]   [facility]    [phone]   [facility] [address] [place], [place] [address] [phone]   [facility] [place] [address], [place] [phone]   Planned Parenthood     [phone]   [facility]    [phone]   Community Health and [facility]  [address], [place] Phone:  [phone], Fax:  [phone] Hours of Operation:  9 am - 6 pm, M-F.  Also accepts Medicaid/Medicare and self-pay.  [facility]  [address], [place] Phone: [phone], Fax: [phone]. Hours of Operation:  8:30 am - 5:30 pm, M-F.  Also accepts Medicaid and self-pay.  [facility] [place] [address], [place] Phone: [phone]) [phone]   [facility] [address], [place], [place] [phone], Ext. [phone] Mondays & Thursdays: 7-9 AM.  First 15 patients are seen on a first come, first serve basis.    Medicaid-accepting [facility] Providers:  Organization         Address  Phone   Notes  [facility] [address], [place] [phone]) [phone] Also accepts self-pay patients.  [facility] [address], [place]  [phone]   [facility] [address], [place] [phone]   [facility] [address] [place] [address], [place] [phone]) [phone]   Renaye RakersVeita Bland [address], [place]   [phone]) [phone] Only accepts [place] Access [place] patients after they have their name applied to their card.   Self-Pay (no insurance) in [facility] [place]:  Organization         Address  Phone   Notes  Sickle Cell Patients, [facility] [address], [place] [phone]) [phone]   [facility] [address], [place] [phone]) [phone]   [facility] [place]  [address], [place] [phone]) [phone]   [facility]/Dr. Osei-Bonsu  [address] [place] [address], [place] or [phone], [place] [phone] Phone number for both [place] and [place] locations is the same.  Urgent Medical and  [facility] [address], [place] [phone]) [phone]   [facility]ue [address] [place] [address], [place] or [address] [phone]   [facility] [address], [place] [phone], phone; [phone], fax Sees patients 1st and 3rd Saturday of every month.  Must not qualify for public or private insurance (i.e. Medicaid, Medicare, [place] Health Choice, Veterans' Benefits)  Household income should be no more than 200%  of the poverty level The clinic cannot treat you if you are pregnant or think you are pregnant  Sexually transmitted diseases are not treated at the clinic.    Dental Care: Organization         Address  Phone  Notes  [facility] Department of [facility] [address] [place], [place] [phone] Accepts children up to age 87 who are enrolled in [place] or [place] Health Choice; pregnant women with a Medicaid card; and children who have applied for Medicaid or [place] Health Choice, but were declined, whose parents can pay a reduced fee at time of service.  [facility] Department of [facility]  [address], [place] [phone] Accepts children up to age 57 who are enrolled in [place] or [place] Health Choice; pregnant women with a Medicaid card; and children who have applied for Medicaid or  Health Choice, but were declined, whose parents can pay a reduced fee at time of service.  [facility]  [address] [place], [place] [phone] Patients are seen by appointment only. Walk-ins are not accepted. [facility] will see patients 31 years of age and older. Monday - Tuesday (8am-5pm) Most Wednesdays (8:30-5pm) $30 per visit, cash only  [facility] Access PROGRAM  [address], [facility] [phone] Patients are seen by appointment only. Walk-ins are not accepted. [facility] will see patients 41 years of age and  older. One Wednesday Evening (Monthly: Volunteer Based).  $30 per visit, cash only  [employer]  [phone] for adults; Children under age 74, call Graduate Pediatric Dentistry at [phone]. Children aged 44-14, please call [phone] to request a pediatric application.  Dental services are provided in all areas of dental care including fillings, crowns and bridges, complete and partial dentures, implants, gum treatment, root canals, and extractions. Preventive care is also provided. Treatment is provided to both adults and children. Patients are selected via a lottery and there is often a waiting list.   [facility] [address] [place]  [phone] [email or web address]   [facility] [address] [place], [place] [phone], Ext. [phone] Second and Fourth Thursday of each month, opens at 6:30 AM; Clinic ends at 9 AM.  Patients are seen on a first-come first-served basis, and a limited number are seen during each clinic.   [facility]  [address] Ether [place]ins [place], [place] [phone]   Eligibility Requirements You must have lived in [place], [place], or [place] counties for at least the last three months.   You cannot be eligible for state or federal sponsored [place], including [employer], [place], or [employer].   You generally cannot be eligible for healthcare insurance through your employer.    How to apply: Eligibility screenings are held every Tuesday and Wednesday afternoon from 1:00 pm until 4:00 pm. You do not need an appointment for the interview!  [facility] [address] [place], [place] [phone]   [facility] Health Department  [phone]   [facility] Health Department  [phone]   [facility] Health Department  [phone]    Behavioral Health Resources in the Community: Intensive Outpatient Programs Organization          Address  Phone  Notes  [facility] [address] [place], [place] [phone]   [facility] Outpatient  [address], [place], [place] [phone]   [facility]: Alcohol [facility] Drug S[facility] [address], [place], [place]  [phone]   [facility] [address],  [place], [place] [phone] or [phone]   Substance Abuse Resources Organization         Address  Phone  Notes  [facility] Drug Services  [phone]   [facility]  [phone]   [facility]  [phone]   [facility]  [phone]   Residential & Outpatient Substance Abuse Program  [phone]   Psychological Services Organization         Address  Phone  Notes  [facility]  [phone]- [phone]   [facility]  [phone]- [phone]   [facility] [address], [place] [phone] or [phone]    Mobile Crisis Teams Organization         Address  Phone  Notes  [facility], Mobile Crisis Care Unit  [phone]   [facility]  [address] [place], [place] [phone]   Doristine LocksSharon DeEsch [address] [place] [phone]    Self-Help/Support Groups Organization         Address  Phone             Notes  [facility]oc[facility] of [place] - variety of support groups  336- [phone] Call for more information  Narcotics Anonymous (NA), Caring Services [address], [place]  2 meetings at this location   Statisticianesidential Treatment Programs Organization         Address  Phone  Notes  [facility] [address],    [place]  [phone]   [facility]  [address], [place] [phone], [place], [place] [phone]   [facility] [address], [place] [phone] Admissions: 8am-3pm M-F  [facility] [address]    [place], [place] [phone]   [facility] [address], [place], [place] [phone]   [facility] [address]  [place], [place] [phone]   [facility] - Intensive Outpatient [address], [place], [place] [phone]   [facility] ([facility] [address]  [place], [place] [phone] or [phone]   [facility] (RTS) [address] [place], [place] [phone] Accepts Medicaid  [facility] [address]  [place] [phone] Substance Abuse/Addiction Treatment   [facility] Behavioral Health Resources Organization         Address  Phone  Notes  [facility]  [phone]) [phone]   Angie FavaJulie Brannon, PhD [address], [address] [place], [place]   [phone](336) [phone] or [phone](336) [phone]   [facility]   [address] [place], [place] [phone](336) [phone]   [facility] [address], [place], [place] [phone] Insurance/Medicaid/sponsorship through [facility]  Faith and Families [address]                                    [place], [place] [phone] Therapy/tele-psych/case  [facility] [address].   [place], [place] [phone](336) [phone]    Dr. Lolly MustacheArfeen  [phone](336) [phone]   [facility]  [employer] [facility] 1) [address], [place] 2) [address], [place] 3)  [address],  [place] [phone]  [phone]   [place] [phone] or [phone] (After Hours)

## 2013-09-14 NOTE — ED Provider Notes (Signed)
CSN: [id number]     Arrival date & time 09/13/13  2217 History   First MD Initiated Contact with Patient 09/13/13 2258     Chief Complaint  Patient presents with  . Otalgia  . Facial Pain    HPI  Sandra House is a [age] y.o. female with a PMH of HTN, DM, thyroid disease, and hypothyroidism who presents to the ED for evaluation of otalgia and facial pain. History was provided by the patient. Patient states she has had an aching pain in her right ear for the past week. She states she has had hearing loss in her right ear, but not complete loss of hearing. She also has had white discharge from the right ear. No tinnitus or dizziness. Patient has had pain radiating from her right ear to the right side of her face. This has also led to headaches. Patient states that last week she had rhinorrhea, congestion and sore throat however this resolved. States she was diagnosed with an ear infection in her right ear at an urgent care clinic in March. Was prescribed ear drops which she has been taking but this has not improved her condition. Was not prescribed oral antibiotics. States condition improved until 1 week ago when it returned and worsened. Patient denies fevers, chills, change in appetite/activity.    Past Medical History  Diagnosis Date  . Hypertension   . Diabetes mellitus   . Obesity   . Thyroid disease   . Hypothyroidism    Past Surgical History  Procedure Laterality Date  . Wisdom tooth extraction    . Cesarean section  10/25/2011    Procedure: CESAREAN SECTION;  Surgeon: Lesly DukesKelly H Leggett, MD;  Location: [facility];  Service: Gynecology;  Laterality: N/A;  Primary cesarean section of baby boy at [id number] APGAR   Family History  Problem Relation Age of Onset  . Cancer Paternal Aunt   . Cancer Paternal Uncle   . Diabetes Paternal Grandmother   . Diabetes Paternal Grandfather   . Anesthesia problems Neg Hx    History  Substance Use Topics  . Smoking status: Never Smoker   . Smokeless  tobacco: Never Used  . Alcohol Use: No   OB History   Grav Para Term Preterm Abortions TAB SAB Ect Mult Living   1 1 0 1 0 0 0 0 0 1      Review of Systems  Constitutional: Negative for fever, chills, diaphoresis, activity change, appetite change and fatigue.  HENT: Positive for congestion (resolved), ear discharge, ear pain, hearing loss, rhinorrhea (resolved) and sore throat (resolved). Negative for facial swelling, mouth sores, postnasal drip, sinus pressure and tinnitus.   Respiratory: Negative for cough.   Cardiovascular: Negative for chest pain.  Gastrointestinal: Negative for nausea and vomiting.  Musculoskeletal: Negative for myalgias, neck pain and neck stiffness.  Neurological: Positive for headaches. Negative for dizziness, weakness and light-headedness.    Allergies  Review of patient's allergies indicates no known allergies.  Home Medications   Prior to Admission medications   Medication Sig Start Date End Date Taking? Authorizing Provider  acetaminophen (TYLENOL) 500 MG tablet Take 1,000 mg by mouth every 6 (six) hours as needed (pain).   Yes Historical Provider, MD  albuterol (PROVENTIL HFA;VENTOLIN HFA) 108 (90 BASE) MCG/ACT inhaler Inhale 2 puffs into the lungs every 6 (six) hours as needed for wheezing or shortness of breath.   Yes Historical Provider, MD  butalbital-acetaminophen-caffeine (FIORICET) 50-325-40 MG per tablet Take 1-2 tablets by mouth every 6 (  six) hours as needed for headache. 08/12/13 08/12/14 Yes Fayrene HelperBowie Tran, PA-C  diazepam (VALIUM) 5 MG tablet Take 1 tablet (5 mg total) by mouth every 12 (twelve) hours as needed for anxiety. 08/12/13  Yes Fayrene HelperBowie Tran, PA-C  metFORMIN (GLUCOPHAGE) 1000 MG tablet Take 1 tablet (1,000 mg total) by mouth 2 (two) times daily. 07/27/13  Yes Lauren Doretha ImusM Parker, PA-C  sucralfate (CARAFATE) 1 G tablet Take 1 tablet (1 g total) by mouth 2 (two) times daily. 08/12/13 08/19/13  Fayrene HelperBowie Tran, PA-C   BP 147/79  Pulse 99  Temp(Src) 98.7 F  (37.1 C) (Oral)  Resp 20  SpO2 98%  LMP 09/04/2013  Filed Vitals:   09/13/13 2223 09/14/13 0108  BP: 147/79 138/76  Pulse: 99 97  Temp: 98.7 F (37.1 C)   TempSrc: Oral   Resp: 20 18  SpO2: 98% 99%    Physical Exam  Nursing note and vitals reviewed. Constitutional: She is oriented to person, place, and time. She appears well-developed and well-nourished. No distress.  Non-toxic  HENT:  Head: Normocephalic and atraumatic.  Right Ear: External ear normal.  Left Ear: External ear normal.  Nose: Nose normal.  Mouth/Throat: Oropharynx is clear and moist. No oropharyngeal exudate.  Right TM with erythema, edema and bulging. Minimal white discharge present in the [facility]. No mastoid or tragal tenderness. Hearing intact in the right ear however is decreased compared to the left. Left TM gray and translucent. No erythema to the posterior pharynx. Tonsils without edema or exudates. Uvula midline. No trismus. No difficulty controlling secretions. No facial edema throughout.  Eyes: Conjunctivae are normal. Pupils are equal, round, and reactive to light. Right eye exhibits no discharge. Left eye exhibits no discharge.  Neck: Normal range of motion. Neck supple.  No cervical lymphadenopathy. No nuchal rigidity.   Cardiovascular: Normal rate, regular rhythm and normal heart sounds.  Exam reveals no gallop and no friction rub.   No murmur heard. Pulmonary/Chest: Effort normal and breath sounds normal. No respiratory distress. She has no wheezes. She has no rales. She exhibits no tenderness.  Abdominal: Soft. She exhibits no distension. There is no tenderness.  Musculoskeletal: Normal range of motion. She exhibits no edema and no tenderness.  Neurological: She is alert and oriented to person, place, and time.  Skin: Skin is warm and dry. She is not diaphoretic.     ED Course  Procedures (including critical care time) Labs Review Labs Reviewed - No data to display  Imaging Review No results  found.   EKG Interpretation None      MDM   Sandra House is a 23 y.o. female with a PMH of HTN, DM, thyroid disease, and hypothyroidism who presents to the ED for evaluation of otalgia and facial pain. Etiology of ear pain possibly due to otitis media and externa. Patient afebrile and non-toxic in appearance. No evidence of mastoiditis or systemic infection. Will treat with PO Augmentin and Floxin drops. Instructed patient to follow-up with PCP. Return precautions, discharge instructions, and follow-up was discussed with the patient before discharge.     Final impressions: 1. Otitis media   2. Otitis externa       Greer EeJessica Katlin Jahaan Vanwagner PA-C            Jillyn LedgerJessica K Tait Balistreri, PA-C 09/15/13 [phone]

## 2013-09-16 NOTE — ED Provider Notes (Signed)
Medical screening examination/treatment/procedure(s) were performed by non-physician practitioner and as supervising physician I was immediately available for consultation/collaboration.   EKG Interpretation None       Alexee Delsanto, MD 09/16/13 0050 

## 2013-12-17 ENCOUNTER — Encounter (HOSPITAL_COMMUNITY): Payer: Self-pay | Admitting: Emergency Medicine

## 2013-12-17 ENCOUNTER — Emergency Department (HOSPITAL_COMMUNITY)
Admission: EM | Admit: 2013-12-17 | Discharge: 2013-12-17 | Disposition: A | Discharge status: Home / Self Care | Payer: Medicaid Other | Attending: Emergency Medicine | Admitting: Emergency Medicine

## 2013-12-17 DIAGNOSIS — Z794 Long term (current) use of insulin: Secondary | ICD-10-CM | POA: Insufficient documentation

## 2013-12-17 DIAGNOSIS — E669 Obesity, unspecified: Secondary | ICD-10-CM | POA: Insufficient documentation

## 2013-12-17 DIAGNOSIS — R197 Diarrhea, unspecified: Secondary | ICD-10-CM | POA: Diagnosis not present

## 2013-12-17 DIAGNOSIS — R1033 Periumbilical pain: Secondary | ICD-10-CM | POA: Diagnosis present

## 2013-12-17 DIAGNOSIS — H6091 Unspecified otitis externa, right ear: Secondary | ICD-10-CM

## 2013-12-17 DIAGNOSIS — I10 Essential (primary) hypertension: Secondary | ICD-10-CM | POA: Diagnosis not present

## 2013-12-17 DIAGNOSIS — Z3202 Encounter for pregnancy test, result negative: Secondary | ICD-10-CM | POA: Diagnosis not present

## 2013-12-17 DIAGNOSIS — Z792 Long term (current) use of antibiotics: Secondary | ICD-10-CM | POA: Diagnosis not present

## 2013-12-17 DIAGNOSIS — E119 Type 2 diabetes mellitus without complications: Secondary | ICD-10-CM | POA: Insufficient documentation

## 2013-12-17 DIAGNOSIS — R110 Nausea: Secondary | ICD-10-CM | POA: Diagnosis not present

## 2013-12-17 DIAGNOSIS — R109 Unspecified abdominal pain: Secondary | ICD-10-CM | POA: Insufficient documentation

## 2013-12-17 DIAGNOSIS — H60399 Other infective otitis externa, unspecified ear: Secondary | ICD-10-CM | POA: Diagnosis not present

## 2013-12-17 LAB — LIPASE, BLOOD: LIPASE: 35 U/L (ref 11–59)

## 2013-12-17 LAB — COMPREHENSIVE METABOLIC PANEL
ALT: 25 U/L (ref 0–35)
AST: 19 U/L (ref 0–37)
Albumin: 3.8 g/dL (ref 3.5–5.2)
Alkaline Phosphatase: 56 U/L (ref 39–117)
Anion gap: 16 — ABNORMAL HIGH (ref 5–15)
BILIRUBIN TOTAL: 0.4 mg/dL (ref 0.3–1.2)
BUN: 8 mg/dL (ref 6–23)
CHLORIDE: 98 meq/L (ref 96–112)
CO2: 24 meq/L (ref 19–32)
CREATININE: 0.57 mg/dL (ref 0.50–1.10)
Calcium: 9.4 mg/dL (ref 8.4–10.5)
GFR calc non Af Amer: >90 mL/min (ref >90)
GLUCOSE: 410 mg/dL — AB (ref 70–99)
Potassium: 3.7 mEq/L (ref 3.7–5.3)
Sodium: 138 mEq/L (ref 137–147)
Total Protein: 7.8 g/dL (ref 6.0–8.3)

## 2013-12-17 LAB — CBC WITH DIFFERENTIAL/PLATELET
Basophils Absolute: 0 10*3/uL (ref 0.0–0.1)
Basophils Relative: 0 % (ref 0–1)
Eosinophils Absolute: 0.1 10*3/uL (ref 0.0–0.7)
Eosinophils Relative: 1 % (ref 0–5)
HEMATOCRIT: 39 % (ref 36.0–46.0)
Hemoglobin: 12.3 g/dL (ref 12.0–15.0)
LYMPHS PCT: 48 % — AB (ref 12–46)
Lymphs Abs: 4.8 10*3/uL — ABNORMAL HIGH (ref 0.7–4.0)
MCH: 23.4 pg — ABNORMAL LOW (ref 26.0–34.0)
MCHC: 31.5 g/dL (ref 30.0–36.0)
MCV: 74.3 fL — ABNORMAL LOW (ref 78.0–100.0)
Monocytes Absolute: 0.4 10*3/uL (ref 0.1–1.0)
Monocytes Relative: 4 % (ref 3–12)
NEUTROS ABS: 4.7 10*3/uL (ref 1.7–7.7)
Neutrophils Relative %: 47 % (ref 43–77)
Platelets: 114 10*3/uL — ABNORMAL LOW (ref 150–400)
RBC: 5.25 MIL/uL — AB (ref 3.87–5.11)
RDW: 15.3 % (ref 11.5–15.5)
WBC: 10 10*3/uL (ref 4.0–10.5)

## 2013-12-17 LAB — URINE MICROSCOPIC-ADD ON

## 2013-12-17 LAB — URINALYSIS, ROUTINE W REFLEX MICROSCOPIC
BILIRUBIN URINE: NEGATIVE
KETONES UR: NEGATIVE mg/dL
Nitrite: NEGATIVE
PROTEIN: NEGATIVE mg/dL
Specific Gravity, Urine: 1.044 — ABNORMAL HIGH (ref 1.005–1.030)
Urobilinogen, UA: 0.2 mg/dL (ref 0.0–1.0)
pH: 5.5 (ref 5.0–8.0)

## 2013-12-17 LAB — PREGNANCY, URINE: Preg Test, Ur: NEGATIVE

## 2013-12-17 MED ORDER — CIPROFLOXACIN-DEXAMETHASONE 0.3-0.1 % OT SUSP: 4.0000 [drp] | Freq: Two times a day (BID) | OTIC | Status: AC

## 2013-12-17 MED ORDER — ANTIPYRINE-BENZOCAINE 5.4-1.4 % OT SOLN: 3.0000 [drp] | OTIC | Status: DC | PRN

## 2013-12-17 MED ORDER — ONDANSETRON 4 MG PO TBDP
4.0000 mg | ORAL_TABLET | Freq: Once | ORAL | Status: AC
  Administered 2013-12-17: 4 mg via ORAL
  Filled 2013-12-17: qty 1

## 2013-12-17 MED ORDER — DICYCLOMINE HCL 20 MG PO TABS: 20.0000 mg | ORAL_TABLET | Freq: Two times a day (BID) | ORAL | Status: DC

## 2013-12-17 MED ORDER — HYDROCODONE-ACETAMINOPHEN 5-325 MG PO TABS
1.0000 | ORAL_TABLET | Freq: Once | ORAL | Status: AC
  Administered 2013-12-17: 1 via ORAL
  Filled 2013-12-17: qty 1

## 2013-12-17 NOTE — ED Notes (Signed)
Pt alert, NAD, calm, interactive, resps e/u, speaking in clear complete sentences, VSS. [facility] Pt c/o R ear pain with pus like drainage, also abd pain.

## 2013-12-17 NOTE — ED Provider Notes (Signed)
CSN: [id number]     Arrival date & time 12/17/13  0358 History   First MD Initiated Contact with Patient 12/17/13 0645     Chief Complaint  Patient presents with  . Abdominal Pain     (Consider location/radiation/quality/duration/timing/severity/associated sxs/prior Treatment) HPI  Patient to the ED with complaints of periumbilical pain with nausea and diarrhea for the past two days and ear pain that is intermittent for the past two weeks.   Abdominal pains: The abdominal pain has been for two days and is intermittent. When she has pain it lasts approx 1 minute or less and is described as cramping and sometimes sharp. She has had < 2 episodes of diarrhea in total. She has had some normal bowel movements. She reports that she is late for her depo and feels as though her menstrual cycle is trying to start. She also reports having more gas than normal. She is not currently having any pain in her abdomen, she figured she should "get it evaluated since i'm here for my ear already".  Right ear: Her right ear has been hurting intermittently for the past two weeks, within the past 24 hours it has been constant and she has noticed drainage from the ear. Mild sore throat.  No fevers, SOB, CP, wheezing, cough, nasal congestion. She denies frequent ear infections.   Past Medical History  Diagnosis Date  . Hypertension   . Diabetes mellitus   . Obesity   . Thyroid disease   . Hypothyroidism    Past Surgical History  Procedure Laterality Date  . Wisdom tooth extraction    . Cesarean section  10/25/2011    Procedure: CESAREAN SECTION;  Surgeon: Lesly DukesKelly H Leggett, MD;  Location: [facility];  Service: Gynecology;  Laterality: N/A;  Primary cesarean section of baby boy at [address]1941 [facility]GAR   Family History  Problem Relation Age of Onset  . Cancer Paternal Aunt   . Cancer Paternal Uncle   . Diabetes Paternal Grandmother   . Diabetes Paternal Grandfather   . Anesthesia problems Neg Hx    History  Substance  Use Topics  . Smoking status: Never Smoker   . Smokeless tobacco: Never Used  . Alcohol Use: No   OB History   Grav Para Term Preterm Abortions TAB SAB Ect Mult Living   1 1 0 1 0 0 0 0 0 1      Review of Systems    Review of Systems  Gen: no weight loss, fevers, chills, night sweats  Eyes: no discharge or drainage, no occular pain or visual changes  Ear:  + Right ear pain Nose: no epistaxis or rhinorrhea  Mouth: no dental pain, no sore throat  Neck: no neck pain  Lungs:No wheezing, coughing or hemoptysis CV: no chest pain, palpitations, dependent edema or orthopnea  Abd: + abdominal pain and diarrhea No nausea, vomiting GU: no dysuria or gross hematuria  MSK:  No muscle weakness or pain Neuro: no headache, no focal neurologic deficits  Skin: no rash or wounds Psyche: no complaints     Allergies  Review of patient's allergies indicates no known allergies.  Home Medications   Prior to Admission medications   Medication Sig Start Date End Date Taking? Authorizing Provider  albuterol (PROVENTIL HFA;VENTOLIN HFA) 108 (90 BASE) MCG/ACT inhaler Inhale 2 puffs into the lungs every 6 (six) hours as needed for wheezing or shortness of breath.   Yes Historical Provider, MD  ibuprofen (ADVIL,MOTRIN) 800 MG tablet Take 800 mg by  mouth every 8 (eight) hours as needed for moderate pain.   Yes Historical Provider, MD  Insulin Detemir (LEVEMIR FLEXPEN) 100 UNIT/ML Pen Inject 60 Units into the skin every morning.   Yes Historical Provider, MD  medroxyPROGESTERone (DEPO-PROVERA) 150 MG/ML injection Inject 150 mg into the muscle every 3 (three) months.   Yes Historical Provider, MD  metFORMIN (GLUCOPHAGE) 1000 MG tablet Take 1 tablet (1,000 mg total) by mouth 2 (two) times daily. 07/27/13  Yes Lauren Doretha Imus, PA-C  ciprofloxacin-dexamethasone (CIPRODEX) otic suspension Place 4 drops into the right ear 2 (two) times daily. 12/17/13 12/26/13  Dorthula Matas, PA-C  dicyclomine (BENTYL) 20 MG  tablet Take 1 tablet (20 mg total) by mouth 2 (two) times daily. 12/17/13   Donata Reddick Irine Seal, PA-C   BP 138/73  Pulse 95  Temp(Src) 98 F (36.7 C) (Oral)  Resp 20  Ht 5\' 3"  (1.6 m)  Wt 275 lb (124.739 kg)  BMI 48.73 kg/m2  SpO2 100% Physical Exam  Nursing note and vitals reviewed. Constitutional: She appears well-developed and well-nourished. No distress.  HENT:  Head: Normocephalic and atraumatic.  Right Ear: No lacerations. There is drainage and swelling. No mastoid tenderness.  Left Ear: Tympanic membrane and ear canal normal.  Eyes: Pupils are equal, round, and reactive to light.  Neck: Normal range of motion. Neck supple.  Cardiovascular: Normal rate and regular rhythm.   Pulmonary/Chest: Effort normal.  Abdominal: Soft. Bowel sounds are normal. She exhibits no distension and no fluid wave. There is no tenderness. There is no rigidity, no rebound, no guarding, no CVA tenderness and negative Murphy's sign.  Exam limited by body habitus  Neurological: She is alert.  Skin: Skin is warm and dry.    ED Course  Procedures (including critical care time) Labs Review Labs Reviewed  CBC WITH DIFFERENTIAL - Abnormal; Notable for the following:    RBC 5.25 (*)    MCV 74.3 (*)    MCH 23.4 (*)    Platelets 114 (*)    Lymphocytes Relative 48 (*)    Lymphs Abs 4.8 (*)    All other components within normal limits  COMPREHENSIVE METABOLIC PANEL - Abnormal; Notable for the following:    Glucose, Bld 410 (*)    Anion gap 16 (*)    All other components within normal limits  URINALYSIS, ROUTINE W REFLEX MICROSCOPIC - Abnormal; Notable for the following:    Specific Gravity, Urine 1.044 (*)    Glucose, UA >1000 (*)    Hgb urine dipstick TRACE (*)    Leukocytes, UA SMALL (*)    All other components within normal limits  LIPASE, BLOOD  PREGNANCY, URINE  URINE MICROSCOPIC-ADD ON    Imaging Review No results found.   EKG Interpretation None      MDM   Final diagnoses:   Otitis externa, right  Diarrhea  Abdominal cramps    The patients abdominal pains are very mild, intermittent and are not concerning at this time for any concerning acute intraabdominal pathology. She has not had pain in the ED. She has chronically low platelets, her glucose is elevated, she has been advised to take her medication which she has in her car (she has not taken her medications since last night). Negative urine pregnancy and urinalysis.   Given rx for Cirpodex otic for ear infection and Rx for bentyl for abdominal cramping.  22 y.o.Sandra House's evaluation in the Emergency Department is complete. It has been determined that no acute  conditions requiring further emergency intervention are present at this time. The patient/guardian have been advised of the diagnosis and plan. We have discussed signs and symptoms that warrant return to the ED, such as changes or worsening in symptoms.  Vital signs are stable at discharge. Filed Vitals:   12/17/13 0725  BP: 138/73  Pulse: 95  Temp:   Resp: 20    Patient/guardian has voiced understanding and agreed to follow-up with the PCP or specialist.     Dorthula Matas, PA-C 12/17/13 [phone]

## 2013-12-17 NOTE — ED Notes (Signed)
Pt. reports mid abdominal pain with nausea and diarrhea  for 2 days / right ear ache for several weeks . Denies fever .

## 2013-12-17 NOTE — Discharge Instructions (Signed)
Otitis Externa °Otitis externa is a bacterial or fungal infection of the outer ear canal. This is the area from the eardrum to the outside of the ear. Otitis externa is sometimes called "swimmer's ear." °CAUSES  °Possible causes of infection include: °· Swimming in dirty water. °· Moisture remaining in the ear after swimming or bathing. °· Mild injury (trauma) to the ear. °· Objects stuck in the ear (foreign body). °· Cuts or scrapes (abrasions) on the outside of the ear. °SIGNS AND SYMPTOMS  °The first symptom of infection is often itching in the ear canal. Later signs and symptoms may include swelling and redness of the ear canal, ear pain, and yellowish-white fluid (pus) coming from the ear. The ear pain may be worse when pulling on the earlobe. °DIAGNOSIS  °Your health care provider will perform a physical exam. A sample of fluid may be taken from the ear and examined for bacteria or fungi. °TREATMENT  °Antibiotic ear drops are often given for 10 to 14 days. Treatment may also include pain medicine or corticosteroids to reduce itching and swelling. °HOME CARE INSTRUCTIONS  °· Apply antibiotic ear drops to the ear canal as prescribed by your health care provider. °· Take medicines only as directed by your health care provider. °· If you have diabetes, follow any additional treatment instructions from your health care provider. °· Keep all follow-up visits as directed by your health care provider. °PREVENTION  °· Keep your ear dry. Use the corner of a towel to absorb water out of the ear canal after swimming or bathing. °· Avoid scratching or putting objects inside your ear. This can damage the ear canal or remove the protective wax that lines the canal. This makes it easier for bacteria and fungi to grow. °· Avoid swimming in lakes, polluted water, or poorly chlorinated pools. °· You may use ear drops made of rubbing alcohol and vinegar after swimming. Combine equal parts of white vinegar and alcohol in a bottle.  Put 3 or 4 drops into each ear after swimming. °SEEK MEDICAL CARE IF:  °· You have a fever. °· Your ear is still red, swollen, painful, or draining pus after 3 days. °· Your redness, swelling, or pain gets worse. °· You have a severe headache. °· You have redness, swelling, pain, or tenderness in the area behind your ear. °MAKE SURE YOU:  °· Understand these instructions. °· Will watch your condition. °· Will get help right away if you are not doing well or get worse. °Document Released: 05/12/2005 Document Revised: 09/26/2013 Document Reviewed: 05/29/2011 °[employer]® Patient Information ©2015 [employer]. This information is not intended to replace advice given to you by your health care provider. Make sure you discuss any questions you have with your health care provider. ° °Ear Drops °You have been diagnosed with a condition requiring you to put drops of medicine into your outer ear. °HOME CARE INSTRUCTIONS  °· Put drops in the affected ear as instructed. After putting the drops in, you will need to lie down with the affected ear facing up for ten minutes so the drops will remain in the ear canal and run down and fill the canal. Continue using the ear drops for as long as directed by your health care provider. °· Prior to getting up, put a cotton ball gently in your ear canal. Leave enough of the cotton ball out so it can be easily removed. Do not attempt to push this down into the canal with a cotton-tipped swab   or other instrument. °· Do not irrigate or wash out your ears if you have had a perforated eardrum or mastoid surgery, or unless instructed to do so by your health care provider. °· Keep appointments with your health care provider as instructed. °· Finish all medicine, or use for the length of time prescribed by your health care provider. Continue the drops even if your problem seems to be doing well after a couple days, or continue as instructed. °SEEK MEDICAL CARE IF: °· You become worse or develop  increasing pain. °· You notice any unusual drainage from your ear (particularly if the drainage has a bad smell). °· You develop hearing difficulties. °· You experience a serious form of dizziness in which you feel as if the room is spinning, and you feel nauseated (vertigo). °· The outside of your ear becomes red or swollen or both. This may be a sign of an allergic reaction. °MAKE SURE YOU:  °· Understand these instructions. °· Will watch your condition. °· Will get help right away if you are not doing well or get worse. °Document Released: 05/06/2001 Document Revised: 05/17/2013 Document Reviewed: 12/07/2012 °[employer]® Patient Information ©2015 [employer]. This information is not intended to replace advice given to you by your health care provider. Make sure you discuss any questions you have with your health care provider. ° °

## 2013-12-17 NOTE — ED Provider Notes (Signed)
Medical screening examination/treatment/procedure(s) were performed by non-physician practitioner and as supervising physician I was immediately available for consultation/collaboration.   EKG Interpretation None        Keelee Yankey W Sheriff Rodenberg, MD 12/17/13 1439 

## 2014-03-27 ENCOUNTER — Encounter (HOSPITAL_COMMUNITY): Payer: Self-pay | Admitting: Emergency Medicine

## 2014-06-28 ENCOUNTER — Emergency Department (HOSPITAL_COMMUNITY)
Admission: EM | Admit: 2014-06-28 | Discharge: 2014-06-28 | Disposition: A | Discharge status: Home / Self Care | Payer: Medicaid Other | Attending: Emergency Medicine | Admitting: Emergency Medicine

## 2014-06-28 ENCOUNTER — Encounter (HOSPITAL_COMMUNITY): Payer: Self-pay | Admitting: Emergency Medicine

## 2014-06-28 DIAGNOSIS — Z794 Long term (current) use of insulin: Secondary | ICD-10-CM | POA: Insufficient documentation

## 2014-06-28 DIAGNOSIS — N760 Acute vaginitis: Secondary | ICD-10-CM | POA: Insufficient documentation

## 2014-06-28 DIAGNOSIS — I10 Essential (primary) hypertension: Secondary | ICD-10-CM | POA: Insufficient documentation

## 2014-06-28 DIAGNOSIS — H6091 Unspecified otitis externa, right ear: Secondary | ICD-10-CM

## 2014-06-28 DIAGNOSIS — Z79899 Other long term (current) drug therapy: Secondary | ICD-10-CM | POA: Insufficient documentation

## 2014-06-28 DIAGNOSIS — Z3202 Encounter for pregnancy test, result negative: Secondary | ICD-10-CM | POA: Insufficient documentation

## 2014-06-28 DIAGNOSIS — E1165 Type 2 diabetes mellitus with hyperglycemia: Secondary | ICD-10-CM | POA: Insufficient documentation

## 2014-06-28 DIAGNOSIS — R109 Unspecified abdominal pain: Secondary | ICD-10-CM | POA: Diagnosis present

## 2014-06-28 DIAGNOSIS — R739 Hyperglycemia, unspecified: Secondary | ICD-10-CM

## 2014-06-28 DIAGNOSIS — Z9889 Other specified postprocedural states: Secondary | ICD-10-CM | POA: Insufficient documentation

## 2014-06-28 LAB — CBG MONITORING, ED: GLUCOSE-CAPILLARY: 261 mg/dL — AB (ref 70–99)

## 2014-06-28 LAB — CBC WITH DIFFERENTIAL/PLATELET
BASOS PCT: 0 % (ref 0–1)
Basophils Absolute: 0 10*3/uL (ref 0.0–0.1)
EOS PCT: 1 % (ref 0–5)
Eosinophils Absolute: 0.1 10*3/uL (ref 0.0–0.7)
HCT: 39.2 % (ref 36.0–46.0)
Hemoglobin: 13 g/dL (ref 12.0–15.0)
LYMPHS ABS: 4.5 10*3/uL — AB (ref 0.7–4.0)
LYMPHS PCT: 48 % — AB (ref 12–46)
MCH: 23.8 pg — AB (ref 26.0–34.0)
MCHC: 33.2 g/dL (ref 30.0–36.0)
MCV: 71.8 fL — ABNORMAL LOW (ref 78.0–100.0)
Monocytes Absolute: 0.5 10*3/uL (ref 0.1–1.0)
Monocytes Relative: 5 % (ref 3–12)
NEUTROS ABS: 4.3 10*3/uL (ref 1.7–7.7)
Neutrophils Relative %: 46 % (ref 43–77)
Platelets: 168 10*3/uL (ref 150–400)
RBC: 5.46 MIL/uL — AB (ref 3.87–5.11)
RDW: 14.8 % (ref 11.5–15.5)
WBC: 9.4 10*3/uL (ref 4.0–10.5)

## 2014-06-28 LAB — I-STAT CHEM 8, ED
BUN: 10 mg/dL (ref 6–23)
CREATININE: 0.5 mg/dL (ref 0.50–1.10)
Calcium, Ion: 1.12 mmol/L (ref 1.12–1.23)
Chloride: 100 mmol/L (ref 96–112)
GLUCOSE: 378 mg/dL — AB (ref 70–99)
HEMATOCRIT: 44 % (ref 36.0–46.0)
Hemoglobin: 15 g/dL (ref 12.0–15.0)
POTASSIUM: 4.2 mmol/L (ref 3.5–5.1)
Sodium: 138 mmol/L (ref 135–145)
TCO2: 23 mmol/L (ref 0–100)

## 2014-06-28 LAB — WET PREP, GENITAL
CLUE CELLS WET PREP: NONE SEEN
WBC WET PREP: NONE SEEN
Yeast Wet Prep HPF POC: NONE SEEN

## 2014-06-28 LAB — POC URINE PREG, ED: PREG TEST UR: NEGATIVE

## 2014-06-28 MED ORDER — AMOXICILLIN 500 MG PO CAPS: 500.0000 mg | ORAL_CAPSULE | Freq: Three times a day (TID) | ORAL | Status: DC

## 2014-06-28 MED ORDER — ANTIPYRINE-BENZOCAINE 55-14 MG/ML OT SOLN
4.0000 [drp] | Freq: Once | OTIC | Status: DC
  Filled 2014-06-28: qty 1

## 2014-06-28 MED ORDER — FLUCONAZOLE 100 MG PO TABS
150.0000 mg | ORAL_TABLET | Freq: Once | ORAL | Status: AC
  Administered 2014-06-28: 150 mg via ORAL
  Filled 2014-06-28: qty 2

## 2014-06-28 MED ORDER — CIPROFLOXACIN-DEXAMETHASONE 0.3-0.1 % OT SUSP: 4.0000 [drp] | Freq: Two times a day (BID) | OTIC | Status: AC

## 2014-06-28 MED ORDER — SODIUM CHLORIDE 0.9 % IV BOLUS (SEPSIS)
1000.0000 mL | Freq: Once | INTRAVENOUS | Status: AC
Start: 2014-06-28 — End: 2014-06-28
  Administered 2014-06-28: 1000 mL via INTRAVENOUS

## 2014-06-28 MED ORDER — INSULIN ASPART 100 UNIT/ML ~~LOC~~ SOLN
12.0000 [IU] | Freq: Once | SUBCUTANEOUS | Status: AC
  Administered 2014-06-28: 12 [IU] via INTRAVENOUS
  Filled 2014-06-28: qty 1

## 2014-06-28 MED ORDER — METRONIDAZOLE 500 MG PO TABS
2000.0000 mg | ORAL_TABLET | Freq: Once | ORAL | Status: AC
  Administered 2014-06-28: 2000 mg via ORAL
  Filled 2014-06-28: qty 4

## 2014-06-28 NOTE — ED Notes (Signed)
PT c/o mid to lower abd pain x's 2 days, denies nausea or vomiting.  Pt also has vag. Discharge and c/o left ear pain

## 2014-06-28 NOTE — Discharge Instructions (Signed)
It was our pleasure to provide your ER care today - we hope that you feel better.  Take antibiotic as prescribed, and use antibiotic ear drops as prescribed.  Take motrin or aleve as need for pain.  Your blood sugar is high today - drink plenty of fluids/water, follow diabetic diet and continue your diabetes medication, check sugars 4x/day and record values, and follow up with primary care doctor in coming week.   Return to ER if worse, new symptoms, worsening or severe pain, persistent vomiting, other concern.     Otitis Externa Otitis externa is a bacterial or fungal infection of the outer ear canal. This is the area from the eardrum to the outside of the ear. Otitis externa is sometimes called "swimmer's ear." CAUSES  Possible causes of infection include:  Swimming in dirty water.  Moisture remaining in the ear after swimming or bathing.  Mild injury (trauma) to the ear.  Objects stuck in the ear (foreign body).  Cuts or scrapes (abrasions) on the outside of the ear. SIGNS AND SYMPTOMS  The first symptom of infection is often itching in the ear canal. Later signs and symptoms may include swelling and redness of the ear canal, ear pain, and yellowish-white fluid (pus) coming from the ear. The ear pain may be worse when pulling on the earlobe. DIAGNOSIS  Your health care provider will perform a physical exam. A sample of fluid may be taken from the ear and examined for bacteria or fungi. TREATMENT  Antibiotic ear drops are often given for 10 to 14 days. Treatment may also include pain medicine or corticosteroids to reduce itching and swelling. HOME CARE INSTRUCTIONS   Apply antibiotic ear drops to the ear canal as prescribed by your health care provider.  Take medicines only as directed by your health care provider.  If you have diabetes, follow any additional treatment instructions from your health care provider.  Keep all follow-up visits as directed by your health care  provider. PREVENTION   Keep your ear dry. Use the corner of a towel to absorb water out of the ear canal after swimming or bathing.  Avoid scratching or putting objects inside your ear. This can damage the ear canal or remove the protective wax that lines the canal. This makes it easier for bacteria and fungi to grow.  Avoid swimming in lakes, polluted water, or poorly chlorinated pools.  You may use ear drops made of rubbing alcohol and vinegar after swimming. Combine equal parts of white vinegar and alcohol in a bottle. Put 3 or 4 drops into each ear after swimming. SEEK MEDICAL CARE IF:   You have a fever.  Your ear is still red, swollen, painful, or draining pus after 3 days.  Your redness, swelling, or pain gets worse.  You have a severe headache.  You have redness, swelling, pain, or tenderness in the area behind your ear. MAKE SURE YOU:   Understand these instructions.  Will watch your condition.  Will get help right away if you are not doing well or get worse. Document Released: 05/12/2005 Document Revised: 09/26/2013 Document Reviewed: 05/29/2011 [facility] Patient Information 2015 [place], [place]. This information is not intended to replace advice given to you by your health care provider. Make sure you discuss any questions you have with your health care provider.    Otitis Media Otitis media is redness, soreness, and inflammation of the middle ear. Otitis media may be caused by allergies or, most commonly, by infection. Often it occurs  as a complication of the common cold. SIGNS AND SYMPTOMS Symptoms of otitis media may include:  Earache.  Fever.  Ringing in your ear.  Headache.  Leakage of fluid from the ear. DIAGNOSIS To diagnose otitis media, your health care provider will examine your ear with an otoscope. This is an instrument that allows your health care provider to see into your ear in order to examine your eardrum. Your health care provider also will  ask you questions about your symptoms. TREATMENT  Typically, otitis media resolves on its own within 3-5 days. Your health care provider may prescribe medicine to ease your symptoms of pain. If otitis media does not resolve within 5 days or is recurrent, your health care provider may prescribe antibiotic medicines if he or she suspects that a bacterial infection is the cause. HOME CARE INSTRUCTIONS   If you were prescribed an antibiotic medicine, finish it all even if you start to feel better.  Take medicines only as directed by your health care provider.  Keep all follow-up visits as directed by your health care provider. SEEK MEDICAL CARE IF:  You have otitis media only in one ear, or bleeding from your nose, or both.  You notice a lump on your neck.  You are not getting better in 3-5 days.  You feel worse instead of better. SEEK IMMEDIATE MEDICAL CARE IF:   You have pain that is not controlled with medicine.  You have swelling, redness, or pain around your ear or stiffness in your neck.  You notice that part of your face is paralyzed.  You notice that the bone behind your ear (mastoid) is tender when you touch it. MAKE SURE YOU:   Understand these instructions.  Will watch your condition.  Will get help right away if you are not doing well or get worse. Document Released: 02/15/2004 Document Revised: 09/26/2013 Document Reviewed: 12/07/2012 [facility] Patient Information 2015 [place]. This information is not intended to replace advice given to you by your health care provider. Make sure you discuss any questions you have with your health care provider.    Vaginitis Vaginitis is an inflammation of the vagina. It is most often caused by a change in the normal balance of the bacteria and yeast that live in the vagina. This change in balance causes an overgrowth of certain bacteria or yeast, which causes the inflammation. There are different types of vaginitis, but the  most common types are:  Bacterial vaginosis.  Yeast infection (candidiasis).  Trichomoniasis vaginitis. This is a sexually transmitted infection (STI).  Viral vaginitis.  Atropic vaginitis.  Allergic vaginitis. CAUSES  The cause depends on the type of vaginitis. Vaginitis can be caused by:  Bacteria (bacterial vaginosis).  Yeast (yeast infection).  A parasite (trichomoniasis vaginitis)  A virus (viral vaginitis).  Low hormone levels (atrophic vaginitis). Low hormone levels can occur during pregnancy, breastfeeding, or after menopause.  Irritants, such as bubble baths, scented tampons, and feminine sprays (allergic vaginitis). Other factors can change the normal balance of the yeast and bacteria that live in the vagina. These include:  Antibiotic medicines.  Poor hygiene.  Diaphragms, vaginal sponges, spermicides, birth control pills, and intrauterine devices (IUD).  Sexual intercourse.  Infection.  Uncontrolled diabetes.  A weakened immune system. SYMPTOMS  Symptoms can vary depending on the cause of the vaginitis. Common symptoms include:  Abnormal vaginal discharge.  The discharge is white, gray, or yellow with bacterial vaginosis.  The discharge is thick, white, and cheesy with a  yeast infection.  The discharge is frothy and yellow or greenish with trichomoniasis.  A bad vaginal odor.  The odor is fishy with bacterial vaginosis.  Vaginal itching, pain, or swelling.  Painful intercourse.  Pain or burning when urinating. Sometimes, there are no symptoms. TREATMENT  Treatment will vary depending on the type of infection.   Bacterial vaginosis and trichomoniasis are often treated with antibiotic creams or pills.  Yeast infections are often treated with antifungal medicines, such as vaginal creams or suppositories.  Viral vaginitis has no cure, but symptoms can be treated with medicines that relieve discomfort. Your sexual partner should be treated  as well.  Atrophic vaginitis may be treated with an estrogen cream, pill, suppository, or vaginal ring. If vaginal dryness occurs, lubricants and moisturizing creams may help. You may be told to avoid scented soaps, sprays, or douches.  Allergic vaginitis treatment involves quitting the use of the product that is causing the problem. Vaginal creams can be used to treat the symptoms. HOME CARE INSTRUCTIONS   Take all medicines as directed by your caregiver.  Keep your genital area clean and dry. Avoid soap and only rinse the area with water.  Avoid douching. It can remove the healthy bacteria in the vagina.  Do not use tampons or have sexual intercourse until your vaginitis has been treated. Use sanitary pads while you have vaginitis.  Wipe from front to back. This avoids the spread of bacteria from the rectum to the vagina.  Let air reach your genital area.  Wear cotton underwear to decrease moisture buildup.  Avoid wearing underwear while you sleep until your vaginitis is gone.  Avoid tight pants and underwear or nylons without a cotton panel.  Take off wet clothing (especially bathing suits) as soon as possible.  Use mild, non-scented products. Avoid using irritants, such as:  Scented feminine sprays.  Fabric softeners.  Scented detergents.  Scented tampons.  Scented soaps or bubble baths.  Practice safe sex and use condoms. Condoms may prevent the spread of trichomoniasis and viral vaginitis. SEEK MEDICAL CARE IF:   You have abdominal pain.  You have a fever or persistent symptoms for more than 2-3 days.  You have a fever and your symptoms suddenly get worse. Document Released: 03/09/2007 Document Revised: 02/04/2012 Document Reviewed: 10/23/2011 [facility] Patient Information 2015 [place], [place]. This information is not intended to replace advice given to you by your health care provider. Make sure you discuss any questions you have with your health care  provider.    Abdominal Pain Many things can cause abdominal pain. Usually, abdominal pain is not caused by a disease and will improve without treatment. It can often be observed and treated at home. Your health care provider will do a physical exam and possibly order blood tests and X-rays to help determine the seriousness of your pain. However, in many cases, more time must pass before a clear cause of the pain can be found. Before that point, your health care provider may not know if you need more testing or further treatment. HOME CARE INSTRUCTIONS  Monitor your abdominal pain for any changes. The following actions may help to alleviate any discomfort you are experiencing:  Only take over-the-counter or prescription medicines as directed by your health care provider.  Do not take laxatives unless directed to do so by your health care provider.  Try a clear liquid diet (broth, tea, or water) as directed by your health care provider. Slowly move to a bland diet as  tolerated. SEEK MEDICAL CARE IF:  You have unexplained abdominal pain.  You have abdominal pain associated with nausea or diarrhea.  You have pain when you urinate or have a bowel movement.  You experience abdominal pain that wakes you in the night.  You have abdominal pain that is worsened or improved by eating food.  You have abdominal pain that is worsened with eating fatty foods.  You have a fever. SEEK IMMEDIATE MEDICAL CARE IF:   Your pain does not go away within 2 hours.  You keep throwing up (vomiting).  Your pain is felt only in portions of the abdomen, such as the right side or the left lower portion of the abdomen.  You pass bloody or black tarry stools. MAKE SURE YOU:  Understand these instructions.   Will watch your condition.   Will get help right away if you are not doing well or get worse.  Document Released: 02/19/2005 Document Revised: 05/17/2013 Document Reviewed: 01/19/2013 [facility]  Patient Information 2015 [place], [place]. This information is not intended to replace advice given to you by your health care provider. Make sure you discuss any questions you have with your health care provider.    Hyperglycemia Hyperglycemia occurs when the glucose (sugar) in your blood is too high. Hyperglycemia can happen for many reasons, but it most often happens to people who do not know they have diabetes or are not managing their diabetes properly.  CAUSES  Whether you have diabetes or not, there are other causes of hyperglycemia. Hyperglycemia can occur when you have diabetes, but it can also occur in other situations that you might not be as aware of, such as: Diabetes  If you have diabetes and are having problems controlling your blood glucose, hyperglycemia could occur because of some of the following reasons:  Not following your meal plan.  Not taking your diabetes medications or not taking it properly.  Exercising less or doing less activity than you normally do.  Being sick. Pre-diabetes  This cannot be ignored. Before people develop Type 2 diabetes, they almost always have "pre-diabetes." This is when your blood glucose levels are higher than normal, but not yet high enough to be diagnosed as diabetes. Research has shown that some long-term damage to the body, especially the heart and circulatory system, may already be occurring during pre-diabetes. If you take action to manage your blood glucose when you have pre-diabetes, you may delay or prevent Type 2 diabetes from developing. Stress  If you have diabetes, you may be "diet" controlled or on oral medications or insulin to control your diabetes. However, you may find that your blood glucose is higher than usual in the hospital whether you have diabetes or not. This is often referred to as "stress hyperglycemia." Stress can elevate your blood glucose. This happens because of hormones put out by the body during times of  stress. If stress has been the cause of your high blood glucose, it can be followed regularly by your caregiver. That way he/she can make sure your hyperglycemia does not continue to get worse or progress to diabetes. Steroids  Steroids are medications that act on the infection fighting system (immune system) to block inflammation or infection. One side effect can be a rise in blood glucose. Most people can produce enough extra insulin to allow for this rise, but for those who cannot, steroids make blood glucose levels go even higher. It is not unusual for steroid treatments to "uncover" diabetes that is developing. It  is not always possible to determine if the hyperglycemia will go away after the steroids are stopped. A special blood test called an A1c is sometimes done to determine if your blood glucose was elevated before the steroids were started. SYMPTOMS  Thirsty.  Frequent urination.  Dry mouth.  Blurred vision.  Tired or fatigue.  Weakness.  Sleepy.  Tingling in feet or leg. DIAGNOSIS  Diagnosis is made by monitoring blood glucose in one or all of the following ways:  A1c test. This is a chemical found in your blood.  Fingerstick blood glucose monitoring.  Laboratory results. TREATMENT  First, knowing the cause of the hyperglycemia is important before the hyperglycemia can be treated. Treatment may include, but is not be limited to:  Education.  Change or adjustment in medications.  Change or adjustment in meal plan.  Treatment for an illness, infection, etc.  More frequent blood glucose monitoring.  Change in exercise plan.  Decreasing or stopping steroids.  Lifestyle changes. HOME CARE INSTRUCTIONS   Test your blood glucose as directed.  Exercise regularly. Your caregiver will give you instructions about exercise. Pre-diabetes or diabetes which comes on with stress is helped by exercising.  Eat wholesome, balanced meals. Eat often and at regular, fixed  times. Your caregiver or nutritionist will give you a meal plan to guide your sugar intake.  Being at an ideal weight is important. If needed, losing as little as 10 to 15 pounds may help improve blood glucose levels. SEEK MEDICAL CARE IF:   You have questions about medicine, activity, or diet.  You continue to have symptoms (problems such as increased thirst, urination, or weight gain). SEEK IMMEDIATE MEDICAL CARE IF:   You are vomiting or have diarrhea.  Your breath smells fruity.  You are breathing faster or slower.  You are very sleepy or incoherent.  You have numbness, tingling, or pain in your feet or hands.  You have chest pain.  Your symptoms get worse even though you have been following your caregiver's orders.  If you have any other questions or concerns. Document Released: 11/05/2000 Document Revised: 08/04/2011 Document Reviewed: 09/08/2011 [facility] Patient Information 2015 [place], [place]. This information is not intended to replace advice given to you by your health care provider. Make sure you discuss any questions you have with your health care provider.    Diabetes Mellitus and Food It is important for you to manage your blood sugar (glucose) level. Your blood glucose level can be greatly affected by what you eat. Eating healthier foods in the appropriate amounts throughout the day at about the same time each day will help you control your blood glucose level. It can also help slow or prevent worsening of your diabetes mellitus. Healthy eating may even help you improve the level of your blood pressure and reach or maintain a healthy weight.  HOW CAN FOOD AFFECT ME? Carbohydrates Carbohydrates affect your blood glucose level more than any other type of food. Your dietitian will help you determine how many carbohydrates to eat at each meal and teach you how to count carbohydrates. Counting carbohydrates is important to keep your blood glucose at a healthy level,  especially if you are using insulin or taking certain medicines for diabetes mellitus. Alcohol Alcohol can cause sudden decreases in blood glucose (hypoglycemia), especially if you use insulin or take certain medicines for diabetes mellitus. Hypoglycemia can be a life-threatening condition. Symptoms of hypoglycemia (sleepiness, dizziness, and disorientation) are similar to symptoms of having too much alcohol.  If your health care provider has given you approval to drink alcohol, do so in moderation and use the following guidelines:  Women should not have more than one drink per day, and men should not have more than two drinks per day. One drink is equal to:  12 oz of beer.  5 oz of wine.  1 oz of hard liquor.  Do not drink on an empty stomach.  Keep yourself hydrated. Have water, diet soda, or unsweetened iced tea.  Regular soda, juice, and other mixers might contain a lot of carbohydrates and should be counted. WHAT FOODS ARE NOT RECOMMENDED? As you make food choices, it is important to remember that all foods are not the same. Some foods have fewer nutrients per serving than other foods, even though they might have the same number of calories or carbohydrates. It is difficult to get your body what it needs when you eat foods with fewer nutrients. Examples of foods that you should avoid that are high in calories and carbohydrates but low in nutrients include:  Trans fats (most processed foods list trans fats on the Nutrition Facts label).  Regular soda.  Juice.  Candy.  Sweets, such as cake, pie, doughnuts, and cookies.  Fried foods. WHAT FOODS CAN I EAT? Have nutrient-rich foods, which will nourish your body and keep you healthy. The food you should eat also will depend on several factors, including:  The calories you need.  The medicines you take.  Your weight.  Your blood glucose level.  Your blood pressure level.  Your cholesterol level. You also should eat a  variety of foods, including:  Protein, such as meat, poultry, fish, tofu, nuts, and seeds (lean animal proteins are best).  Fruits.  Vegetables.  Dairy products, such as milk, cheese, and yogurt (low fat is best).  Breads, grains, pasta, cereal, rice, and beans.  Fats such as olive oil, trans fat-free margarine, canola oil, avocado, and olives. DOES EVERYONE WITH DIABETES MELLITUS HAVE THE SAME MEAL PLAN? Because every person with diabetes mellitus is different, there is not one meal plan that works for everyone. It is very important that you meet with a dietitian who will help you create a meal plan that is just right for you. Document Released: 02/06/2005 Document Revised: 05/17/2013 Document Reviewed: 04/08/2013 [facility] Patient Information 2015 [place], [place]. This information is not intended to replace advice given to you by your health care provider. Make sure you discuss any questions you have with your health care provider.

## 2014-06-28 NOTE — ED Notes (Signed)
Pt made aware to return if symptoms worsen or if any life threatening symptoms occur.   

## 2014-06-28 NOTE — ED Provider Notes (Addendum)
CSN: [id number]     Arrival date & time 06/28/14  1626 History   First MD Initiated Contact with Patient 06/28/14 1845     Chief Complaint  Patient presents with  . Abdominal Pain     (Consider location/radiation/quality/duration/timing/severity/associated sxs/prior Treatment) Patient is a 24 y.o. female presenting with abdominal pain. The history is provided by the patient.  Abdominal Pain Associated symptoms: vaginal discharge   Associated symptoms: no chest pain, no chills, no cough, no diarrhea, no dysuria, no fever, no hematuria, no shortness of breath, no sore throat and no vomiting   pt c/o several complaints this visit - bil ear pain r>l, hx eom/om. States started with right ear, now both. Pain dull, moderate. No hearing loss. No headaches. No neck pain or stiffness. No fever or chills. Denies recent abx.   No sore throat. Pt also c/o a mid to lower abd pain, dull, mild, felt like cramping earlier, currently improved. Normal appetite. No nv. No abd distension. Had normal bm today. No hx hernia. Only prior abd surgery is remote hx c section. No hx pud, gallstones or pancreatitis. Pt also notes mild vaginal discharge. States similar symptoms when blood sugar high in past. No dysuria or hematuria.     Past Medical History  Diagnosis Date  . Hypertension   . Diabetes mellitus   . Obesity   . Thyroid disease   . Hypothyroidism    Past Surgical History  Procedure Laterality Date  . Wisdom tooth extraction    . Cesarean section  10/25/2011    Procedure: CESAREAN SECTION;  Surgeon: Lesly Dukes, MD;  Location: [facility];  Service: Gynecology;  Laterality: N/A;  Primary cesarean section of baby boy at 38 APGAR   Family History  Problem Relation Age of Onset  . Cancer Paternal Aunt   . Cancer Paternal Uncle   . Diabetes Paternal Grandmother   . Diabetes Paternal Grandfather   . Anesthesia problems Neg Hx    History  Substance Use Topics  . Smoking status: Never Smoker   .  Smokeless tobacco: Never Used  . Alcohol Use: No   OB History    Gravida Para Term Preterm AB TAB SAB Ectopic Multiple Living       Review of Systems  Constitutional: Negative for fever and chills.  HENT: Positive for ear pain. Negative for sore throat.   Eyes: Negative for redness.  Respiratory: Negative for cough and shortness of breath.   Cardiovascular: Negative for chest pain.  Gastrointestinal: Positive for abdominal pain. Negative for vomiting and diarrhea.  Endocrine: Negative for polydipsia and polyuria.  Genitourinary: Positive for vaginal discharge. Negative for dysuria, hematuria and flank pain.  Musculoskeletal: Negative for back pain and neck pain.  Skin: Negative for rash.  Neurological: Negative for headaches.  Hematological: Does not bruise/bleed easily.  Psychiatric/Behavioral: Negative for confusion.      Allergies  Review of patient's allergies indicates no known allergies.  Home Medications   Prior to Admission medications   Medication Sig Start Date End Date Taking? Authorizing Provider  albuterol (PROVENTIL HFA;VENTOLIN HFA) 108 (90 BASE) MCG/ACT inhaler Inhale 2 puffs into the lungs every 6 (six) hours as needed for wheezing or shortness of breath.    Historical Provider, MD  antipyrine-benzocaine Lyla Son) otic solution Place 3-4 drops into the right ear every 2 (two) hours as needed for ear pain. 12/17/13   Tiffany Irine Seal, PA-C  dicyclomine (BENTYL)  20 MG tablet Take 1 tablet (20 mg total) by mouth 2 (two) times daily. 12/17/13   Tiffany Irine Seal, PA-C  ibuprofen (ADVIL,MOTRIN) 800 MG tablet Take 800 mg by mouth every 8 (eight) hours as needed for moderate pain.    Historical Provider, MD  Insulin Detemir (LEVEMIR FLEXPEN) 100 UNIT/ML Pen Inject 60 Units into the skin every morning.    Historical Provider, MD  medroxyPROGESTERone (DEPO-PROVERA) 150 MG/ML injection Inject 150 mg into the muscle every 3 (three) months.    Historical  Provider, MD  metFORMIN (GLUCOPHAGE) 1000 MG tablet Take 1 tablet (1,000 mg total) by mouth 2 (two) times daily. 07/27/13   Lauren Parker, PA-C   BP 134/71 mmHg  Pulse 99  Temp(Src) 98.3 F (36.8 C)  Resp 18  SpO2 97%  LMP 06/19/2014 Physical Exam  Constitutional: She appears well-developed and well-nourished. No distress.  HENT:  Mouth/Throat: Oropharynx is clear and moist.  Right eom, only able to visualize tm minimally on right. Left eac patent. Tm erythematous. No mastoid tenderness.   Eyes: Conjunctivae are normal. No scleral icterus.  Neck: Normal range of motion. Neck supple. No tracheal deviation present.  Cardiovascular: Normal rate, regular rhythm, normal heart sounds and intact distal pulses.   Pulmonary/Chest: Effort normal and breath sounds normal. No respiratory distress.  Abdominal: Soft. Normal appearance and bowel sounds are normal. She exhibits no distension and no mass. There is no tenderness. There is no rebound and no guarding.  Morbidly obese. No hernia felt.   Genitourinary:  No cva tenderness. Whitish vaginal discharge. Cervix closed. No cmt. No adx masses or tenderness.   Musculoskeletal: She exhibits no edema.  Neurological: She is alert.  Skin: Skin is warm and dry. No rash noted. She is not diaphoretic.  Psychiatric: She has a normal mood and affect.  Nursing note and vitals reviewed.   ED Course  Procedures (including critical care time) Labs Review   Results for orders placed or performed during the hospital encounter of 06/28/14  Wet prep, genital  Result Value Ref Range   Yeast Wet Prep HPF POC NONE SEEN NONE SEEN   Trich, Wet Prep FEW (A) NONE SEEN   Clue Cells Wet Prep HPF POC NONE SEEN NONE SEEN   WBC, Wet Prep HPF POC NONE SEEN NONE SEEN  CBC with Differential  Result Value Ref Range   WBC 9.4 4.0 - 10.5 K/uL   RBC 5.46 (H) 3.87 - 5.11 MIL/uL   Hemoglobin 13.0 12.0 - 15.0 g/dL   HCT 16.1 09.6 - 04.5 %   MCV 71.8 (L) 78.0 - 100.0 fL    MCH 23.8 (L) 26.0 - 34.0 pg   MCHC 33.2 30.0 - 36.0 g/dL   RDW 40.9 81.1 - 91.4 %   Platelets 168 150 - 400 K/uL   Neutrophils Relative % 46 43 - 77 %   Lymphocytes Relative 48 (H) 12 - 46 %   Monocytes Relative 5 3 - 12 %   Eosinophils Relative 1 0 - 5 %   Basophils Relative 0 0 - 1 %   Neutro Abs 4.3 1.7 - 7.7 K/uL   Lymphs Abs 4.5 (H) 0.7 - 4.0 K/uL   Monocytes Absolute 0.5 0.1 - 1.0 K/uL   Eosinophils Absolute 0.1 0.0 - 0.7 K/uL   Basophils Absolute 0.0 0.0 - 0.1 K/uL   Smear Review MORPHOLOGY UNREMARKABLE   POC Urine Pregnancy, ED (do NOT order at [facility])  Result Value Ref Range   Preg  Test, Ur NEGATIVE NEGATIVE  I-Stat Chem 8, ED  Result Value Ref Range   Sodium 138 135 - 145 mmol/L   Potassium 4.2 3.5 - 5.1 mmol/L   Chloride 100 96 - 112 mmol/L   BUN 10 6 - 23 mg/dL   Creatinine, Ser 2.530.50 0.50 - 1.10 mg/dL   Glucose, Bld 664378 (H) 70 - 99 mg/dL   Calcium, Ion 4.031.12 4.741.12 - 1.23 mmol/L   TCO2 23 0 - 100 mmol/L   Hemoglobin 15.0 12.0 - 15.0 g/dL   HCT 25.944.0 56.336.0 - 87.546.0 %  CBG monitoring, ED  Result Value Ref Range   Glucose-Capillary 261 (H) 70 - 99 mg/dL      MDM   Labs.   Reviewed nursing notes and prior charts for additional history.   Pt notes similar vaginal discharge when blood sugars elevated. Whitish d/c ?yeast, fluconazole po.  Recheck abd soft nt.   bw 378.  Iv ns bolus. novolog sq.  Recheck bs improved. abd soft nt. No nv. Tolerating po fluids.  Ear exam c/w otitis ext/media. Will rx.   Wet prep w trich, will rx.        Suzi RootsKevin E Ahna Konkle, MD 06/28/14 2139

## 2014-06-28 NOTE — ED Notes (Addendum)
Pt reports mid abdominal pain radiating to lower right quadrant since yesterday accompanied by diarrhea.  Pt denies emesis and any urinary symptoms.  Pt also reports bilateral pain in ears.  Last BM this morning, pt alert and oriented.

## 2014-06-29 LAB — GC/CHLAMYDIA PROBE AMP (~~LOC~~) NOT AT ARMC
Chlamydia: NEGATIVE
Neisseria Gonorrhea: NEGATIVE

## 2014-10-16 ENCOUNTER — Emergency Department (HOSPITAL_COMMUNITY): Payer: Medicaid Other

## 2014-10-16 ENCOUNTER — Encounter (HOSPITAL_COMMUNITY): Payer: Self-pay | Admitting: Family Medicine

## 2014-10-16 ENCOUNTER — Emergency Department (HOSPITAL_COMMUNITY)
Admission: EM | Admit: 2014-10-16 | Discharge: 2014-10-16 | Disposition: A | Discharge status: Home / Self Care | Payer: Medicaid Other | Attending: Emergency Medicine | Admitting: Emergency Medicine

## 2014-10-16 DIAGNOSIS — J159 Unspecified bacterial pneumonia: Secondary | ICD-10-CM | POA: Insufficient documentation

## 2014-10-16 DIAGNOSIS — R05 Cough: Secondary | ICD-10-CM

## 2014-10-16 DIAGNOSIS — I10 Essential (primary) hypertension: Secondary | ICD-10-CM | POA: Insufficient documentation

## 2014-10-16 DIAGNOSIS — R059 Cough, unspecified: Secondary | ICD-10-CM

## 2014-10-16 DIAGNOSIS — E119 Type 2 diabetes mellitus without complications: Secondary | ICD-10-CM | POA: Insufficient documentation

## 2014-10-16 DIAGNOSIS — R197 Diarrhea, unspecified: Secondary | ICD-10-CM | POA: Diagnosis not present

## 2014-10-16 DIAGNOSIS — E669 Obesity, unspecified: Secondary | ICD-10-CM | POA: Insufficient documentation

## 2014-10-16 DIAGNOSIS — J189 Pneumonia, unspecified organism: Secondary | ICD-10-CM

## 2014-10-16 DIAGNOSIS — Z792 Long term (current) use of antibiotics: Secondary | ICD-10-CM | POA: Diagnosis not present

## 2014-10-16 LAB — COMPREHENSIVE METABOLIC PANEL
ALBUMIN: 3.4 g/dL — AB (ref 3.5–5.0)
ALT: 31 U/L (ref 14–54)
ANION GAP: 7 (ref 5–15)
AST: 41 U/L (ref 15–41)
Alkaline Phosphatase: 55 U/L (ref 38–126)
CHLORIDE: 101 mmol/L (ref 101–111)
CO2: 28 mmol/L (ref 22–32)
Calcium: 8.7 mg/dL — ABNORMAL LOW (ref 8.9–10.3)
Creatinine, Ser: 0.69 mg/dL (ref 0.44–1.00)
GFR calc non Af Amer: >60 mL/min (ref >60)
GLUCOSE: 236 mg/dL — AB (ref 65–99)
Potassium: 3.8 mmol/L (ref 3.5–5.1)
Sodium: 136 mmol/L (ref 135–145)
Total Bilirubin: 0.4 mg/dL (ref 0.3–1.2)
Total Protein: 7.5 g/dL (ref 6.5–8.1)

## 2014-10-16 LAB — CBC WITH DIFFERENTIAL/PLATELET
BASOS PCT: 0 % (ref 0–1)
Basophils Absolute: 0 10*3/uL (ref 0.0–0.1)
EOS PCT: 1 % (ref 0–5)
Eosinophils Absolute: 0.1 10*3/uL (ref 0.0–0.7)
HCT: 40 % (ref 36.0–46.0)
HEMOGLOBIN: 12.9 g/dL (ref 12.0–15.0)
LYMPHS PCT: 45 % (ref 12–46)
Lymphs Abs: 3.1 10*3/uL (ref 0.7–4.0)
MCH: 23.2 pg — AB (ref 26.0–34.0)
MCHC: 32.3 g/dL (ref 30.0–36.0)
MCV: 72.1 fL — ABNORMAL LOW (ref 78.0–100.0)
Monocytes Absolute: 0.5 10*3/uL (ref 0.1–1.0)
Monocytes Relative: 7 % (ref 3–12)
NEUTROS ABS: 3.1 10*3/uL (ref 1.7–7.7)
NEUTROS PCT: 47 % (ref 43–77)
PLATELETS: 171 10*3/uL (ref 150–400)
RBC: 5.55 MIL/uL — ABNORMAL HIGH (ref 3.87–5.11)
RDW: 15 % (ref 11.5–15.5)
WBC: 6.8 10*3/uL (ref 4.0–10.5)

## 2014-10-16 MED ORDER — BENZONATATE 100 MG PO CAPS: 100.0000 mg | ORAL_CAPSULE | Freq: Three times a day (TID) | ORAL | Status: DC | PRN

## 2014-10-16 MED ORDER — AEROCHAMBER PLUS FLO-VU MEDIUM MISC
1.0000 | Freq: Once | Status: DC
  Filled 2014-10-16: qty 1

## 2014-10-16 MED ORDER — AZITHROMYCIN 250 MG PO TABS
250.0000 mg | ORAL_TABLET | Freq: Every day | ORAL | Status: DC
Start: 2014-10-16 — End: 2014-12-13

## 2014-10-16 MED ORDER — ALBUTEROL SULFATE (2.5 MG/3ML) 0.083% IN NEBU
2.5000 mg | INHALATION_SOLUTION | Freq: Once | RESPIRATORY_TRACT | Status: AC
  Administered 2014-10-16: 2.5 mg via RESPIRATORY_TRACT
  Filled 2014-10-16: qty 3

## 2014-10-16 MED ORDER — ACETAMINOPHEN 500 MG PO TABS
1000.0000 mg | ORAL_TABLET | Freq: Once | ORAL | Status: AC
  Administered 2014-10-16: 1000 mg via ORAL
  Filled 2014-10-16: qty 2

## 2014-10-16 MED ORDER — ALBUTEROL SULFATE HFA 108 (90 BASE) MCG/ACT IN AERS
2.0000 | INHALATION_SPRAY | RESPIRATORY_TRACT | Status: DC
  Administered 2014-10-16: 2 via RESPIRATORY_TRACT
  Filled 2014-10-16: qty 6.7

## 2014-10-16 NOTE — ED Notes (Signed)
Pt requesting nausea medicine, RN notified

## 2014-10-16 NOTE — Discharge Instructions (Signed)
Pneumonia, Adult Take tylenol or motrin for fever.  Take antibiotics as prescribed. Follow up with your primary care provider.  Return if no symptom improvement in 48 hours. Pneumonia is an infection of the lungs. It may be caused by a germ (virus or bacteria). Some types of pneumonia can spread easily from person to person. This can happen when you cough or sneeze. HOME CARE  Only take medicine as told by your doctor.  Take your medicine (antibiotics) as told. Finish it even if you start to feel better.  Do not smoke.  You may use a vaporizer or humidifier in your room. This can help loosen thick spit (mucus).  Sleep so you are almost sitting up (semi-upright). This helps reduce coughing.  Rest. A shot (vaccine) can help prevent pneumonia. Shots are often advised for:  People over 24 years old.  Patients on chemotherapy.  People with long-term (chronic) lung problems.  People with immune system problems. GET HELP RIGHT AWAY IF:   You are getting worse.  You cannot control your cough, and you are losing sleep.  You cough up blood.  Your pain gets worse, even with medicine.  You have a fever.  Any of your problems are getting worse, not better.  You have shortness of breath or chest pain. MAKE SURE YOU:   Understand these instructions.  Will watch your condition.  Will get help right away if you are not doing well or get worse. Document Released: 10/29/2007 Document Revised: 08/04/2011 Document Reviewed: 08/02/2010 [facility] Patient Information 2015 [place], [place]. This information is not intended to replace advice given to you by your health care provider. Make sure you discuss any questions you have with your health care provider.

## 2014-10-16 NOTE — ED Notes (Signed)
Pt given Sprite Zero for PO challenge.

## 2014-10-16 NOTE — ED Provider Notes (Signed)
CSN: [id number]     Arrival date & time 10/16/14  1341 History  This chart was scribed for non-physician practitioner working, Haynes DageHannah Patel-Mills, PA-C,  with Gwyneth SproutWhitney Plunkett, MD, by Modena JanskyAlbert Thayil, ED Scribe. This patient was seen in room TR02C/TR02C and the patient's care was started at 2:29 PM.  Chief Complaint  Patient presents with  . Generalized Body Aches  . Diarrhea  . Cough   The history is provided by the patient. No language interpreter was used.  HPI Comments: Sandra House is a 24 y.o. female who presents to the Emergency Department complaining of constant moderate sore throat that started 3 days ago. She states that she has been sick with cough, diarrhea, generalized myalgias, rhinorrhea, sore throat, and wheezing since 3 days ago. She reports that she has been having 2-3 episodes of diarrhea without any blood. She reports that she has been having abdominal pain from coughing and 1-2 episodes of vomiting. She states that she used robitussin without any relief. She reports that she has an albuterol inhaler at home but lost it. No sick contacts.   Past Medical History  Diagnosis Date  . Hypertension   . Diabetes mellitus   . Obesity   . Thyroid disease   . Hypothyroidism    Past Surgical History  Procedure Laterality Date  . Wisdom tooth extraction    . Cesarean section  10/25/2011    Procedure: CESAREAN SECTION;  Surgeon: Lesly DukesKelly H Leggett, MD;  Location: [facility];  Service: Gynecology;  Laterality: N/A;  Primary cesarean section of baby boy at [id number] APGAR   Family History  Problem Relation Age of Onset  . Cancer Paternal Aunt   . Cancer Paternal Uncle   . Diabetes Paternal Grandmother   . Diabetes Paternal Grandfather   . Anesthesia problems Neg Hx    History  Substance Use Topics  . Smoking status: Never Smoker   . Smokeless tobacco: Never Used  . Alcohol Use: No   OB History    Gravida Para Term Preterm AB TAB SAB Ectopic Multiple Living   1 1 0 1 0 0 0 0 0 1       Review of Systems  Constitutional: Positive for fever.  HENT: Positive for rhinorrhea and sore throat. Negative for trouble swallowing.   Respiratory: Positive for cough and wheezing.   Cardiovascular: Negative for chest pain.  Gastrointestinal: Positive for vomiting and diarrhea. Negative for abdominal pain and blood in stool.  Genitourinary: Negative for dysuria.  Musculoskeletal: Positive for myalgias (generalized). Negative for neck pain and neck stiffness.  All other systems reviewed and are negative.  Allergies  Review of patient's allergies indicates no known allergies.  Home Medications   Prior to Admission medications   Medication Sig Start Date End Date Taking? Authorizing Provider  albuterol (PROVENTIL HFA;VENTOLIN HFA) 108 (90 BASE) MCG/ACT inhaler Inhale 2 puffs into the lungs every 6 (six) hours as needed for wheezing or shortness of breath.    Historical Provider, MD  amoxicillin (AMOXIL) 500 MG capsule Take 1 capsule (500 mg total) by mouth 3 (three) times daily. 06/28/14   Cathren LaineKevin Steinl, MD  antipyrine-benzocaine Lyla Son(AURALGAN) otic solution Place 3-4 drops into the right ear every 2 (two) hours as needed for ear pain. Patient not taking: Reported on 06/28/2014 12/17/13   Marlon Peliffany Greene, PA-C  azithromycin (ZITHROMAX) 250 MG tablet Take 1 tablet (250 mg total) by mouth daily. Take first 2 tablets together, then 1 every day until finished. 10/16/14   Catha GosselinHanna Patel-Mills,  PA-C  benzonatate (TESSALON PERLES) 100 MG capsule Take 1 capsule (100 mg total) by mouth 3 (three) times daily as needed for cough. 10/16/14   Olivene Cookston Patel-Mills, PA-C  dicyclomine (BENTYL) 20 MG tablet Take 1 tablet (20 mg total) by mouth 2 (two) times daily. Patient not taking: Reported on 06/28/2014 12/17/13   Marlon Pel, PA-C  ibuprofen (ADVIL,MOTRIN) 800 MG tablet Take 800 mg by mouth every 8 (eight) hours as needed for moderate pain.    Historical Provider, MD  Insulin Detemir (LEVEMIR FLEXPEN) 100 UNIT/ML  Pen Inject 60 Units into the skin every morning.    Historical Provider, MD  medroxyPROGESTERone (DEPO-PROVERA) 150 MG/ML injection Inject 150 mg into the muscle every 3 (three) months.    Historical Provider, MD  metFORMIN (GLUCOPHAGE) 1000 MG tablet Take 1 tablet (1,000 mg total) by mouth 2 (two) times daily. Patient not taking: Reported on 06/28/2014 07/27/13   Mellody Drown, PA-C   BP 119/94 mmHg  Pulse 112  Temp(Src) 100.5 F (38.1 C) (Oral)  Resp 18  SpO2 95%  LMP 09/19/2014 Physical Exam  Constitutional: She is oriented to person, place, and time. She appears well-developed and well-nourished. No distress.  HENT:  Head: Normocephalic and atraumatic.  Mouth/Throat: Uvula is midline, oropharynx is clear and moist and mucous membranes are normal. No oropharyngeal exudate, posterior oropharyngeal erythema or tonsillar abscesses.  No anterior or posterior cervical lymphadenopathy.  Eyes: Conjunctivae and EOM are normal.  Neck: Normal range of motion and full passive range of motion without pain. Neck supple.  Cardiovascular: Normal rate, regular rhythm and normal heart sounds.   Pulmonary/Chest: Effort normal. No respiratory distress. She has wheezes.  Bilateral wheezing throughout all lung fields.   Abdominal: Soft. There is no hepatosplenomegaly. There is no tenderness.  Obese  Musculoskeletal: Normal range of motion.  Lymphadenopathy:    She has no cervical adenopathy.  Neurological: She is alert and oriented to person, place, and time.  Skin: Skin is warm and dry.  Psychiatric: She has a normal mood and affect. Her behavior is normal.  Nursing note and vitals reviewed.   ED Course  Procedures (including critical care time) DIAGNOSTIC STUDIES: Oxygen Saturation is 95% on RA, normal by my interpretation.    COORDINATION OF CARE: 2:33 PM- Pt advised of plan for treatment which includes medication, radiology, and labs and pt agrees.  Labs Review Labs Reviewed  CBC WITH  DIFFERENTIAL/PLATELET - Abnormal; Notable for the following:    RBC 5.55 (*)    MCV 72.1 (*)    MCH 23.2 (*)    All other components within normal limits  COMPREHENSIVE METABOLIC PANEL - Abnormal; Notable for the following:    Glucose, Bld 236 (*)    BUN <5 (*)    Calcium 8.7 (*)    Albumin 3.4 (*)    All other components within normal limits    Imaging Review Dg Chest 2 View  10/16/2014   CLINICAL DATA:  Cough, body aches for 3 days  EXAM: CHEST  2 VIEW  COMPARISON:  08/12/2013  FINDINGS: Cardiomediastinal silhouette is stable. There is streaky left base retrocardiac atelectasis or infiltrate best seen on lateral view. No pulmonary edema. Bony thorax is stable.  IMPRESSION: Streaky left base retrocardiac atelectasis or infiltrate best seen on lateral view. No pulmonary edema.   Electronically Signed   By: Natasha Mead M.D.   On: 10/16/2014 14:49     EKG Interpretation None      MDM  Final diagnoses:  Community acquired pneumonia   Patient with history of DM presents for cough, fever, body aches, diarrhea.  She states she vomited and has abdominal pain from coughing so much. On exam her throat is not erythematous and there is no exudates or tonsillar abscess. She has bilateral wheezing throughout lunch fields.  Medications  albuterol (PROVENTIL) (2.5 MG/3ML) 0.083% nebulizer solution 2.5 mg (2.5 mg Nebulization Given 10/16/14 1439)  acetaminophen (TYLENOL) tablet 1,000 mg (1,000 mg Oral Given 10/16/14 1439)   She is able to keep down fluids in the ED. Her labs are not concerning.  Her CXR shows left base atelectasis or infiltrate.  I will treat her for CAP with azithromycin and gave her tessalon for cough. She received an albuterol inhaler in the ED.  I have given her return precautions.  She can take tylenol or motrin for fever.  I also gave her a work note.  I personally performed the services described in this documentation, which was scribed in my presence. The recorded  information has been reviewed and is accurate.     Catha Gosselin, PA-C 10/16/14 2051  Gwyneth Sprout, MD 10/17/14 1758

## 2014-10-16 NOTE — ED Notes (Signed)
Pt here for cough, body aches, diarrhea, sore throat since Friday.

## 2014-10-16 NOTE — ED Notes (Signed)
Pt states she got "sick" 3 days ago with body aches, fever, nasal and chest congestion, diarrhea (2-3 episodes/day) and has vomited 1-2 times in the past 3 days. Pt has  IDDM.

## 2014-10-16 NOTE — ED Notes (Signed)
Breathing tx started

## 2014-12-10 ENCOUNTER — Emergency Department (HOSPITAL_COMMUNITY)
Admission: EM | Admit: 2014-12-10 | Discharge: 2014-12-10 | Disposition: A | Discharge status: Home / Self Care | Payer: Medicaid Other | Attending: Emergency Medicine | Admitting: Emergency Medicine

## 2014-12-10 ENCOUNTER — Emergency Department (HOSPITAL_COMMUNITY): Payer: Medicaid Other

## 2014-12-10 ENCOUNTER — Encounter (HOSPITAL_COMMUNITY): Payer: Self-pay | Admitting: Emergency Medicine

## 2014-12-10 DIAGNOSIS — R05 Cough: Secondary | ICD-10-CM

## 2014-12-10 DIAGNOSIS — Z79899 Other long term (current) drug therapy: Secondary | ICD-10-CM | POA: Diagnosis not present

## 2014-12-10 DIAGNOSIS — J029 Acute pharyngitis, unspecified: Secondary | ICD-10-CM

## 2014-12-10 DIAGNOSIS — E119 Type 2 diabetes mellitus without complications: Secondary | ICD-10-CM | POA: Insufficient documentation

## 2014-12-10 DIAGNOSIS — E669 Obesity, unspecified: Secondary | ICD-10-CM | POA: Insufficient documentation

## 2014-12-10 DIAGNOSIS — R059 Cough, unspecified: Secondary | ICD-10-CM

## 2014-12-10 DIAGNOSIS — Z792 Long term (current) use of antibiotics: Secondary | ICD-10-CM | POA: Diagnosis not present

## 2014-12-10 DIAGNOSIS — I10 Essential (primary) hypertension: Secondary | ICD-10-CM | POA: Diagnosis not present

## 2014-12-10 DIAGNOSIS — Z794 Long term (current) use of insulin: Secondary | ICD-10-CM | POA: Diagnosis not present

## 2014-12-10 HISTORY — DX: Unspecified asthma, uncomplicated: J45.909

## 2014-12-10 LAB — CBC WITH DIFFERENTIAL/PLATELET
BASOS PCT: 0 % (ref 0–1)
Basophils Absolute: 0 10*3/uL (ref 0.0–0.1)
EOS ABS: 0.2 10*3/uL (ref 0.0–0.7)
Eosinophils Relative: 2 % (ref 0–5)
HCT: 36.2 % (ref 36.0–46.0)
Hemoglobin: 11.7 g/dL — ABNORMAL LOW (ref 12.0–15.0)
Lymphocytes Relative: 35 % (ref 12–46)
Lymphs Abs: 3.5 10*3/uL (ref 0.7–4.0)
MCH: 23.4 pg — ABNORMAL LOW (ref 26.0–34.0)
MCHC: 32.3 g/dL (ref 30.0–36.0)
MCV: 72.5 fL — ABNORMAL LOW (ref 78.0–100.0)
Monocytes Absolute: 0.6 10*3/uL (ref 0.1–1.0)
Monocytes Relative: 6 % (ref 3–12)
NEUTROS PCT: 57 % (ref 43–77)
Neutro Abs: 5.7 10*3/uL (ref 1.7–7.7)
Platelets: 169 10*3/uL (ref 150–400)
RBC: 4.99 MIL/uL (ref 3.87–5.11)
RDW: 15.3 % (ref 11.5–15.5)
WBC: 9.9 10*3/uL (ref 4.0–10.5)

## 2014-12-10 LAB — I-STAT CHEM 8, ED
BUN: <3 mg/dL — ABNORMAL LOW (ref 6–20)
CHLORIDE: 100 mmol/L — AB (ref 101–111)
Calcium, Ion: 1.15 mmol/L (ref 1.12–1.23)
Creatinine, Ser: 0.5 mg/dL (ref 0.44–1.00)
Glucose, Bld: 284 mg/dL — ABNORMAL HIGH (ref 65–99)
HEMATOCRIT: 40 % (ref 36.0–46.0)
HEMOGLOBIN: 13.6 g/dL (ref 12.0–15.0)
POTASSIUM: 3.7 mmol/L (ref 3.5–5.1)
SODIUM: 138 mmol/L (ref 135–145)
TCO2: 26 mmol/L (ref 0–100)

## 2014-12-10 LAB — I-STAT BETA HCG BLOOD, ED (MC, WL, AP ONLY)

## 2014-12-10 LAB — RAPID STREP SCREEN (MED CTR MEBANE ONLY): STREPTOCOCCUS, GROUP A SCREEN (DIRECT): NEGATIVE

## 2014-12-10 MED ORDER — ALBUTEROL SULFATE (2.5 MG/3ML) 0.083% IN NEBU
5.0000 mg | INHALATION_SOLUTION | Freq: Once | RESPIRATORY_TRACT | Status: AC
  Administered 2014-12-10: 5 mg via RESPIRATORY_TRACT
  Filled 2014-12-10: qty 6

## 2014-12-10 MED ORDER — LIDOCAINE VISCOUS 2 % MT SOLN
15.0000 mL | Freq: Once | OROMUCOSAL | Status: AC
  Administered 2014-12-10: 15 mL via OROMUCOSAL
  Filled 2014-12-10: qty 15

## 2014-12-10 MED ORDER — SODIUM CHLORIDE 0.9 % IV BOLUS (SEPSIS)
1000.0000 mL | Freq: Once | INTRAVENOUS | Status: AC
  Administered 2014-12-10: 1000 mL via INTRAVENOUS

## 2014-12-10 MED ORDER — HYDROCODONE-ACETAMINOPHEN 7.5-325 MG/15ML PO SOLN: 15.0000 mL | Freq: Three times a day (TID) | ORAL | Status: DC | PRN

## 2014-12-10 MED ORDER — HYDROCODONE-ACETAMINOPHEN 7.5-325 MG/15ML PO SOLN
10.0000 mL | Freq: Once | ORAL | Status: AC
  Administered 2014-12-10: 10 mL via ORAL
  Filled 2014-12-10: qty 15

## 2014-12-10 MED ORDER — IOHEXOL 350 MG/ML SOLN
80.0000 mL | Freq: Once | INTRAVENOUS | Status: AC | PRN
  Administered 2014-12-10: 80 mL via INTRAVENOUS

## 2014-12-10 MED ORDER — ONDANSETRON HCL 4 MG/2ML IJ SOLN
4.0000 mg | Freq: Once | INTRAMUSCULAR | Status: AC
  Administered 2014-12-10: 4 mg via INTRAVENOUS
  Filled 2014-12-10: qty 2

## 2014-12-10 MED ORDER — METHYLPREDNISOLONE SODIUM SUCC 125 MG IJ SOLR
125.0000 mg | Freq: Once | INTRAMUSCULAR | Status: AC
  Administered 2014-12-10: 125 mg via INTRAVENOUS
  Filled 2014-12-10: qty 2

## 2014-12-10 MED ORDER — DIPHENHYDRAMINE HCL 50 MG/ML IJ SOLN
25.0000 mg | Freq: Once | INTRAMUSCULAR | Status: AC
  Administered 2014-12-10: 25 mg via INTRAVENOUS
  Filled 2014-12-10: qty 1

## 2014-12-10 NOTE — Discharge Instructions (Signed)
Take the prescribed medication as directed. Follow-up with your primary care physician.  If you do not have one, see resource guide. Return to the ED for new or worsening symptoms.   Emergency Department Resource Guide 1) Find a Doctor and Pay Out of Pocket Although you won't have to find out who is covered by your insurance plan, it is a good idea to ask around and get recommendations. You will then need to call the office and see if the doctor you have chosen will accept you as a new patient and what types of options they offer for patients who are self-pay. Some doctors offer discounts or will set up payment plans for their patients who do not have insurance, but you will need to ask so you aren't surprised when you get to your appointment.  2) Contact Your Local Health Department Not all health departments have doctors that can see patients for sick visits, but many do, so it is worth a call to see if yours does. If you don't know where your local health department is, you can check in your phone book. The CDC also has a tool to help you locate your state's health department, and many state websites also have listings of all of their local health departments.  3) Find a Walk-in Clinic If your illness is not likely to be very severe or complicated, you may want to try a walk in clinic. These are popping up all over the country in pharmacies, drugstores, and shopping centers. They're usually staffed by nurse practitioners or physician assistants that have been trained to treat common illnesses and complaints. They're usually fairly quick and inexpensive. However, if you have serious medical issues or chronic medical problems, these are probably not your best option.  No Primary Care Doctor: - Call Health Connect at  [phone] - they can help you locate a primary care doctor that  accepts your insurance, provides certain services, etc. - Physician Referral Service- [phone]  Chronic Pain  Problems: Organization         Address  Phone   Notes  [facility]  [phone] Patients need to be referred by their primary care doctor.   Medication Assistance: Organization         Address  Phone   Notes  [facility] [address] [place], [place] [address] [phone] --Must be a resident of [place] -- Must have NO insurance coverage whatsoever (no Medicaid/ Medicare, etc.) -- The pt. MUST have a primary care doctor that directs their care regularly and follows them in the community   [facility]ist  [phone]   [employer]  [phone]    Agencies that provide inexpensive medical care: Organization         Address  Phone   Notes  [facility]  [phone]) [phone]   [facility]    [phone]) [phone]   [facility] [address] [place], [place] [address] [phone]   [facility] [address], [place] [phone]   [facility]    [phone]   [facility]    [phone]   [facility]  [address], [place] Phone:  [phone], Fax:  [phone]) [phone] Hours of Operation:  9 am - 6 pm, M-F.  Also accepts Medicaid/Medicare and self-pay.  [facility]  [address], [place] Phone: [phone], Fax: [phone]. Hours of Operation:  8:30 am - 5:30 pm, M-F.  Also accepts Medicaid and self-pay.  °[facility] [place] [address], [place] Phone: [phone]   °[facility] [address], [place], [place] [phone], Ext. 123 Mondays & Thursdays: 7-9 AM.  First 15 patients are seen on a first come, first serve basis. °  ° °Medicaid-accepting [place] Providers: ° °Organization         Address  Phone   Notes  °[facility] [address], [place] [phone] Also  accepts self-pay patients.  °[facility] [address], [place] ° ([phone]) [phone]   °[facility] [address], [place] ([phone]) [phone]   °[facility] [address] [place] [address], [place] ([phone]) [phone]   °Veita Bland [address], [place]  ° [phone]) [phone] Only accepts [place] Access Medicaid patients after they have their name applied to their card.  ° °Self-Pay (no insurance) in [place]: ° °Organization         Address  Phone   Notes  °Sickle Cell Patients, [facility] [address], [place] [phone]) [phone]   °[facility] [address], [place] ([phone]) [phone]   °[facility] ° [address], [place] [phone]) [phone]   °[facility]/Dr. Osei-Bonsu ° [address] [place] [address], [place] or [address], [place] [phone] Phone number for both [place] and [place] locations is the same.  °[facility] [address], [place] ([phone]) [phone]   °[facility] [address] [place] [address], [place] or [address] [phone]) [phone] °([phone]) [phone]   °[facility] [address], [place] ([phone]) [phone], phone; [phone], fax Sees patients 1st and 3rd Saturday of every month.  Must not qualify for public or private insurance (i.e. Medicaid, Medicare, [place] Health Choice, Veterans' Benefits) • Household income should be no more than 200% of the poverty level •The clinic cannot treat you if you are pregnant or think you are pregnant • Sexually transmitted diseases are not treated at the clinic.  ° ° °Dental Care: °Organization         Address  Phone  Notes  °[place] [facility] [address], [place] [phone]) [phone] Accepts children up to age 21 who are enrolled in Medicaid or [place] Health Choice; pregnant  women with a Medicaid card; and children who have applied for Medicaid or [place] Health Choice, but were declined, whose parents can pay a reduced fee at time of service.  °[place] [facility] [place]  [address], [place] [phone]) [phone] Accepts children up to age 21 who are enrolled in Medicaid or [place] Health Choice; pregnant women with a Medicaid card; and children who have applied for Medicaid or [place] Health Choice, but were declined, whose parents can pay a reduced fee at time of service.  °[facility] ° [address], [place] [phone]) [phone] Patients are seen by appointment only. Walk-ins are not accepted. [facility] will see patients 18 years of age and older. °Monday - Tuesday (8am-5pm) °Most Wednesdays (8:30-5pm) °$30 per visit, cash only  °[facility]   [facility]  [address], [facility] [phone] Patients are seen by appointment only. Walk-ins are not accepted. [place] will see patients 37 years of age and older. One Wednesday Evening (Monthly: Volunteer Based).  $30 per visit, cash only  [place]  [phone] for adults; Children under age 77, call Graduate Pediatric Dentistry at [phone]. Children aged 79-14, please call [phone] to request a pediatric application.  Dental services are provided in all areas of dental care including fillings, crowns and bridges, complete and partial dentures, implants, gum treatment, root canals, and extractions. Preventive care is also provided. Treatment is provided to both adults and children. Patients are selected via a lottery and there is often a waiting list.   [facility] [address], [place]  [phone] [email or web address]   [facility] [address] [place], [place] [phone], Ext. [phone] Second and Fourth Thursday of each month, opens at 6:30 AM; Clinic ends at 9 AM.  Patients are  seen on a first-come first-served basis, and a limited number are seen during each clinic.   [facility]  [address] [place], [place] [phone]   Eligibility Requirements You must have lived in [place], [place], or [place] counties for at least the last three months.   You cannot be eligible for state or federal sponsored [employer], including [employer], [place], or [employer].   You generally cannot be eligible for healthcare insurance through your employer.    How to apply: Eligibility screenings are held every Tuesday and Wednesday afternoon from 1:00 pm until 4:00 pm. You do not need an appointment for the interview!  [facility] [address], [place], [place]   [place]  [place]  [phone]    Behavioral Health Resources in the Community: Intensive Outpatient Programs Organization         Address  Phone  Notes  [facility] [place] [address] [place], [place] [phone]   [facility] [address], [place], [place]   [facility] [address] [place], [place]   [place] [address],  [place], [place] or [phone]   Substance Abuse Resources Organization         Address  Phone  Notes  [facility]  [phone]   [employer]  [phone]) [phone]   [facility] [place]   Chinita Pester  [phone]   Residential & Outpatient Substance Abuse Program  [phone]   Psychological Services Organization         Address  Phone  Notes  [facility] [place]  [place]  [phone]   [place] [address] [place] [phone] or [phone]    Mobile Crisis  Teams Organization         Address  Phone  Notes  [facility], Mobile Crisis Care Unit  [phone]   [facility]  [address] [place], [place]   [place] [address], [place] [phone]    Self-Help/Support Groups Organization         Address  Phone             Notes  [facility] [place] - variety of support groups  [phone]- [phone] Call for more information  °Narcotics Anonymous (NA), [facility] [address], °[place]  2 meetings at this location  ° °Residential Treatment Programs °Organization         Address  Phone  Notes  °[facility] [address],    °[place]  [phone]   °[facility] ° [address], [place], [place] [phone]   °[facility] [address], [place] [phone] Admissions: 8am-3pm M-F  °[facility] [address]    °[place], [place] [phone]   °[facility] [address], [place], [place] [phone]   °[facility] [address]  °[place], [place] [phone]   °[facility] - Intensive Outpatient [address], [place], [place] [phone]   °[facility] [address]  °[place], [place] 1-877-615-2722 or [phone]   °[facility] ([facility]) [address] [place], [place] [phone] Accepts Medicaid  °[facility] [address]  °[place] 1-800-659-3381 Substance Abuse/Addiction Treatment  ° °[place] Behavioral Health Resources °Organization         Address  Phone  Notes  °[facility]  (888) [phone]   °Julie Brannon, PhD [address] [place], [place]   (336) [phone] or (336) [phone]   °[facility]   [address] °[place], [place] (336) [phone]   °[facility] [address], [place], [place] (336) [phone]  Insurance/Medicaid/sponsorship through [facility]  °[facility] [address]                                    [place], [place] ([phone]) [phone] Therapy/tele-psych/case  °[facility] [address]  ° [place], [place] (336) [phone]    °Dr. Arfeen  (336) [phone]   °[facility] [place]  [employer] [place] [facility] 1) [address], [place] °2) [address], [place] °3)  [address], [place] (336) [phone] °(336) [phone] ° °(336) [phone]   °[place] Child Abuse Hotline (336) 342-1394 or (336) [phone] (After Hours)    ° ° ° °

## 2014-12-10 NOTE — ED Notes (Signed)
Pt instructed to take deep breqaths, O2 99%

## 2014-12-10 NOTE — ED Notes (Signed)
Per [facility], pt had sore throat x3 days, tried mulptiple treatments no relief, n/v x2 days.

## 2014-12-10 NOTE — ED Provider Notes (Signed)
CSN: [id number]     Arrival date & time 12/10/14  1818 History   First MD Initiated Contact with Patient 12/10/14 1822     Chief Complaint  Patient presents with  . Sore Throat  . Nausea  . Emesis     (Consider location/radiation/quality/duration/timing/severity/associated sxs/prior Treatment) The history is provided by the patient and medical records.   This is a 24 year old female with past medical history significant for hypertension, diabetes, obesity, hypothyroidism, presenting to the ED for 3 days of sore throat. Patient states initially symptoms were mild but have progressively worsened since onset. She reports sensation of throat swelling , difficulty swallowing, and shortness of breath.   She states she is able to handle her secretions, but has nausea and vomiting when trying to eat and drink. She denies any chest pain or abdominal pain. No diarrhea. She reports subjective fevers intermittently. No known sick contacts. She has tried using over-the-counter TheraFlu and Chloraseptic without relief. She has concern that she may be having an allergic reaction to the Chloraseptic as she has never taken this before and symptoms seem to drastically worsened after taking this. She denies any other medication changes.  No ACEI inhibitors.  Past Medical History  Diagnosis Date  . Hypertension   . Diabetes mellitus   . Obesity   . Thyroid disease   . Hypothyroidism    Past Surgical History  Procedure Laterality Date  . Wisdom tooth extraction    . Cesarean section  10/25/2011    Procedure: CESAREAN SECTION;  Surgeon: Lesly Dukes, MD;  Location: [facility];  Service: Gynecology;  Laterality: N/A;  Primary cesarean section of baby boy at 6 APGAR   Family History  Problem Relation Age of Onset  . Cancer Paternal Aunt   . Cancer Paternal Uncle   . Diabetes Paternal Grandmother   . Diabetes Paternal Grandfather   . Anesthesia problems Neg Hx    History  Substance Use Topics  .  Smoking status: Never Smoker   . Smokeless tobacco: Never Used  . Alcohol Use: No   OB History    Gravida Para Term Preterm AB TAB SAB Ectopic Multiple Living   1 1 0 1 0 0 0 0 0 1      Review of Systems  HENT: Positive for sore throat.   All other systems reviewed and are negative.     Allergies  Review of patient's allergies indicates no known allergies.  Home Medications   Prior to Admission medications   Medication Sig Start Date End Date Taking? Authorizing Provider  albuterol (PROVENTIL HFA;VENTOLIN HFA) 108 (90 BASE) MCG/ACT inhaler Inhale 2 puffs into the lungs every 6 (six) hours as needed for wheezing or shortness of breath.    Historical Provider, MD  amoxicillin (AMOXIL) 500 MG capsule Take 1 capsule (500 mg total) by mouth 3 (three) times daily. 06/28/14   Cathren Laine, MD  antipyrine-benzocaine Lyla Son) otic solution Place 3-4 drops into the right ear every 2 (two) hours as needed for ear pain. Patient not taking: Reported on 06/28/2014 12/17/13   Marlon Pel, PA-C  azithromycin (ZITHROMAX) 250 MG tablet Take 1 tablet (250 mg total) by mouth daily. Take first 2 tablets together, then 1 every day until finished. 10/16/14   Hanna Patel-Mills, PA-C  benzonatate (TESSALON PERLES) 100 MG capsule Take 1 capsule (100 mg total) by mouth 3 (three) times daily as needed for cough. 10/16/14   Hanna Patel-Mills, PA-C  dicyclomine (BENTYL) 20 MG tablet Take 1  tablet (20 mg total) by mouth 2 (two) times daily. Patient not taking: Reported on 06/28/2014 12/17/13   Marlon Peliffany Greene, PA-C  ibuprofen (ADVIL,MOTRIN) 800 MG tablet Take 800 mg by mouth every 8 (eight) hours as needed for moderate pain.    Historical Provider, MD  Insulin Detemir (LEVEMIR FLEXPEN) 100 UNIT/ML Pen Inject 60 Units into the skin every morning.    Historical Provider, MD  medroxyPROGESTERone (DEPO-PROVERA) 150 MG/ML injection Inject 150 mg into the muscle every 3 (three) months.    Historical Provider, MD  metFORMIN  (GLUCOPHAGE) 1000 MG tablet Take 1 tablet (1,000 mg total) by mouth 2 (two) times daily. Patient not taking: Reported on 06/28/2014 07/27/13   Mellody DrownLauren Parker, PA-C   BP 147/94 mmHg  Pulse 95  Temp(Src) 98.1 F (36.7 C) (Oral)  Resp 16  SpO2 100%  LMP 11/29/2014   Physical Exam  Constitutional: She is oriented to person, place, and time. She appears well-developed and well-nourished. No distress.  crying  HENT:  Head: Normocephalic and atraumatic.  Mouth/Throat: Uvula is midline and mucous membranes are normal. Posterior oropharyngeal edema and posterior oropharyngeal erythema present.  Slight posterior oropharyngeal edema and erythema; tonsils enlarged bilaterally without exudate; uvula midline without peritonsillar abscess; handling secretions appropriately; speaking softly; no stridor; trachea midline  Eyes: Conjunctivae and EOM are normal. Pupils are equal, round, and reactive to light.  Neck: Normal range of motion. Neck supple.  Cardiovascular: Normal rate, regular rhythm and normal heart sounds.   Pulmonary/Chest: Effort normal and breath sounds normal. No respiratory distress. She has no wheezes.  Abdominal: Soft. Bowel sounds are normal. There is no tenderness. There is no guarding.  Musculoskeletal: Normal range of motion. She exhibits no edema.  Neurological: She is alert and oriented to person, place, and time.  Skin: Skin is warm and dry. She is not diaphoretic.  Psychiatric: She has a normal mood and affect.  Nursing note and vitals reviewed.   ED Course  Procedures (including critical care time) Labs Review Labs Reviewed  CBC WITH DIFFERENTIAL/PLATELET - Abnormal; Notable for the following:    Hemoglobin 11.7 (*)    MCV 72.5 (*)    MCH 23.4 (*)    All other components within normal limits  I-STAT CHEM 8, ED - Abnormal; Notable for the following:    Chloride 100 (*)    BUN <3 (*)    Glucose, Bld 284 (*)    All other components within normal limits  RAPID STREP  SCREEN (NOT AT [facility])  CULTURE, GROUP A STREP  I-STAT BETA HCG BLOOD, ED ([facility], [facility], [facility] ONLY)    Imaging Review Dg Chest 2 View  12/10/2014   CLINICAL DATA:  Acute onset of cough, shortness of breath and fever. Initial encounter.  EXAM: CHEST  2 VIEW  COMPARISON:  Chest radiograph from 10/16/2014  FINDINGS: The lungs are well-aerated and clear. There is no evidence of focal opacification, pleural effusion or pneumothorax.  The heart is normal in size; the mediastinal contour is within normal limits. No acute osseous abnormalities are seen.  IMPRESSION: No acute cardiopulmonary process seen.   Electronically Signed   By: Roanna RaiderJeffery  Chang M.D.   On: 12/10/2014 21:22   Ct Soft Tissue Neck W Contrast  12/10/2014   CLINICAL DATA:  24 year old female with sore throat and difficulty swallowing  EXAM: CT NECK WITH CONTRAST  TECHNIQUE: Multidetector CT imaging of the neck was performed using the standard protocol following the bolus administration of intravenous contrast.  CONTRAST:  80mL OMNIPAQUE IOHEXOL 350 MG/ML SOLN  COMPARISON:  None.  FINDINGS: Evaluation is limited due to streak artifact caused by patient's body habitus.  The visualized upper lungs are clear. The central airways are patent. There is prominence of the adenoid tissues. There is partial opacification of the visualized right sphenoid sinus. Right maxillary sinus retention cyst or polyp noted. The visualized osseous structures appear unremarkable.  The visualized aortic arch and carotid and vertebral arteries appear patent.  There is bilateral cervical adenopathy. There is stranding of the carotid spaces bilaterally. No drainable fluid collection/abscess identified.  The parotid, submandibular, and thyroid glands appear unremarkable.  The esophagus is collapsed.  IMPRESSION:  Bilateral cervical adenopathy. No drainable fluid collection/ abscess.   Electronically Signed   By: Elgie Collard M.D.   On: 12/10/2014 19:47     EKG  Interpretation None      MDM   Final diagnoses:  Cough  Sore throat   24 y.o. F here with cough and sore throat.  States she feels that her throat is swollen and she is having difficulty swallowing.  Patient is  Afebrile, nontoxic. She is slight edema and erythema of her posterior oropharynx without evidence of peritonsillar abscess. She is handling her secretions well. Speaks softly , but no stridor or tracheal deviation.  Does not appear to be an allergic reaction, sx seem more related to URI.  Labwork is reassuring. Rapid strep negative, culture pending. Chest x-ray clear.  CT soft tissue neck with cervical adenopathy which is likely reactive.  Patient was treated with hycet, solu-medrol, benadryl, and viscous lidocaine with some improvement of symptoms.  Patient was able to tolerate PO fluids without difficulty.  Patient did have a few episodes of hypoxia, however during these she was breathing shallowly because "it hurts when the air hits my throat"-- when encouraged to take normal deep breaths, O2 sats remain stable.   Patient was able to ambulate and maintain normal O2 sats.  Do not feel she needs admission for this.  Will d/c home with continued supportive care, Rx hycet.  Strep culture pending.  FU with PCP.  Discussed plan with patient, he/she acknowledged understanding and agreed with plan of care.  Return precautions given for new or worsening symptoms.  Case discussed with attending physician, Dr. Silverio Lay, agrees with assessment and plan of care.  Garlon Hatchet, PA-C 12/10/14 2333  Richardean Canal, MD 12/12/14 [phone]

## 2014-12-10 NOTE — ED Notes (Signed)
Pt placed on 2L [place]

## 2014-12-10 NOTE — ED Notes (Signed)
Ambulated pt to bathroom with Pulse Ox. Pt started at 93%. While ambulating pt's O2 stayed around 95%.

## 2014-12-13 ENCOUNTER — Encounter (HOSPITAL_COMMUNITY): Payer: Self-pay | Admitting: Emergency Medicine

## 2014-12-13 ENCOUNTER — Emergency Department (HOSPITAL_COMMUNITY)
Admission: EM | Admit: 2014-12-13 | Discharge: 2014-12-13 | Disposition: A | Discharge status: Home / Self Care | Payer: Medicaid Other | Attending: Emergency Medicine | Admitting: Emergency Medicine

## 2014-12-13 DIAGNOSIS — H6093 Unspecified otitis externa, bilateral: Secondary | ICD-10-CM | POA: Insufficient documentation

## 2014-12-13 DIAGNOSIS — I889 Nonspecific lymphadenitis, unspecified: Secondary | ICD-10-CM | POA: Diagnosis not present

## 2014-12-13 DIAGNOSIS — E669 Obesity, unspecified: Secondary | ICD-10-CM | POA: Diagnosis not present

## 2014-12-13 DIAGNOSIS — R1110 Vomiting, unspecified: Secondary | ICD-10-CM | POA: Diagnosis not present

## 2014-12-13 DIAGNOSIS — E119 Type 2 diabetes mellitus without complications: Secondary | ICD-10-CM | POA: Insufficient documentation

## 2014-12-13 DIAGNOSIS — I10 Essential (primary) hypertension: Secondary | ICD-10-CM | POA: Diagnosis not present

## 2014-12-13 DIAGNOSIS — Z79899 Other long term (current) drug therapy: Secondary | ICD-10-CM | POA: Diagnosis not present

## 2014-12-13 DIAGNOSIS — J029 Acute pharyngitis, unspecified: Secondary | ICD-10-CM | POA: Diagnosis present

## 2014-12-13 DIAGNOSIS — J45901 Unspecified asthma with (acute) exacerbation: Secondary | ICD-10-CM | POA: Insufficient documentation

## 2014-12-13 LAB — CBG MONITORING, ED
GLUCOSE-CAPILLARY: 294 mg/dL — AB (ref 65–99)
Glucose-Capillary: 294 mg/dL — ABNORMAL HIGH (ref 65–99)

## 2014-12-13 LAB — CULTURE, GROUP A STREP

## 2014-12-13 MED ORDER — ONDANSETRON 4 MG PO TBDP
4.0000 mg | ORAL_TABLET | Freq: Once | ORAL | Status: AC
  Administered 2014-12-13: 4 mg via ORAL
  Filled 2014-12-13: qty 1

## 2014-12-13 MED ORDER — ONDANSETRON 4 MG PO TBDP: 4.0000 mg | ORAL_TABLET | Freq: Three times a day (TID) | ORAL | Status: AC | PRN

## 2014-12-13 MED ORDER — CIPROFLOXACIN-DEXAMETHASONE 0.3-0.1 % OT SUSP: 4.0000 [drp] | Freq: Two times a day (BID) | OTIC | Status: AC

## 2014-12-13 MED ORDER — HYDROCODONE-ACETAMINOPHEN 7.5-325 MG/15ML PO SOLN
10.0000 mL | Freq: Once | ORAL | Status: AC
  Administered 2014-12-13: 10 mL via ORAL
  Filled 2014-12-13: qty 15

## 2014-12-13 MED ORDER — HYDROCODONE-ACETAMINOPHEN 7.5-325 MG/15ML PO SOLN: 15.0000 mL | Freq: Three times a day (TID) | ORAL | Status: AC | PRN

## 2014-12-13 MED ORDER — AMOXICILLIN-POT CLAVULANATE 875-125 MG PO TABS
1.0000 | ORAL_TABLET | Freq: Once | ORAL | Status: AC
  Administered 2014-12-13: 1 via ORAL
  Filled 2014-12-13: qty 1

## 2014-12-13 MED ORDER — AMOXICILLIN-POT CLAVULANATE 875-125 MG PO TABS: 1.0000 | ORAL_TABLET | Freq: Two times a day (BID) | ORAL | Status: AC

## 2014-12-13 NOTE — Discharge Instructions (Signed)
Lymphadenopathy °Lymphadenopathy means "disease of the lymph glands." But the term is usually used to describe swollen or enlarged lymph glands, also called lymph nodes. These are the bean-shaped organs found in many locations including the neck, underarm, and groin. Lymph glands are part of the immune system, which fights infections in your body. Lymphadenopathy can occur in just one area of the body, such as the neck, or it can be generalized, with lymph node enlargement in several areas. The nodes found in the neck are the most common sites of lymphadenopathy. °CAUSES °When your immune system responds to germs (such as viruses or bacteria ), infection-fighting cells and fluid build up. This causes the glands to grow in size. Usually, this is not something to worry about. Sometimes, the glands themselves can become infected and inflamed. This is called lymphadenitis. °Enlarged lymph nodes can be caused by many diseases: °· Bacterial disease, such as strep throat or a skin infection. °· Viral disease, such as a common cold. °· Other germs, such as Lyme disease, tuberculosis, or sexually transmitted diseases. °· Cancers, such as lymphoma (cancer of the lymphatic system) or leukemia (cancer of the white blood cells). °· Inflammatory diseases such as lupus or rheumatoid arthritis. °· Reactions to medications. °Many of the diseases above are rare, but important. This is why you should see your caregiver if you have lymphadenopathy. °SYMPTOMS °· Swollen, enlarged lumps in the neck, back of the head, or other locations. °· Tenderness. °· Warmth or redness of the skin over the lymph nodes. °· Fever. °DIAGNOSIS °Enlarged lymph nodes are often near the source of infection. They can help health care providers diagnose your illness. For instance: °· Swollen lymph nodes around the jaw might be caused by an infection in the mouth. °· Enlarged glands in the neck often signal a throat infection. °· Lymph nodes that are swollen in  more than one area often indicate an illness caused by a virus. °Your caregiver will likely know what is causing your lymphadenopathy after listening to your history and examining you. Blood tests, x-rays, or other tests may be needed. If the cause of the enlarged lymph node cannot be found, and it does not go away by itself, then a biopsy may be needed. Your caregiver will discuss this with you. °TREATMENT °Treatment for your enlarged lymph nodes will depend on the cause. Many times the nodes will shrink to normal size by themselves, with no treatment. Antibiotics or other medicines may be needed for infection. Only take over-the-counter or prescription medicines for pain, discomfort, or fever as directed by your caregiver. °HOME CARE INSTRUCTIONS °Swollen lymph glands usually return to normal when the underlying medical condition goes away. If they persist, contact your health-care provider. He/she might prescribe antibiotics or other treatments, depending on the diagnosis. Take any medications exactly as prescribed. Keep any follow-up appointments made to check on the condition of your enlarged nodes. °SEEK MEDICAL CARE IF: °· Swelling lasts for more than two weeks. °· You have symptoms such as weight loss, night sweats, fatigue, or fever that does not go away. °· The lymph nodes are hard, seem fixed to the skin, or are growing rapidly. °· Skin over the lymph nodes is red and inflamed. This could mean there is an infection. °SEEK IMMEDIATE MEDICAL CARE IF: °· Fluid starts leaking from the area of the enlarged lymph node. °· You develop a fever of 102° F (38.9° C) or greater. °· Severe pain develops (not necessarily at the site of a   large lymph node).  You develop chest pain or shortness of breath.  You develop worsening abdominal pain. MAKE SURE YOU:  Understand these instructions.  Will watch your condition.  Will get help right away if you are not doing well or get worse. Document Released:  02/19/2008 Document Revised: 09/26/2013 Document Reviewed: 02/19/2008 [facility] Patient Information 2015 [place], [place]. This information is not intended to replace advice given to you by your health care provider. Make sure you discuss any questions you have with your health care provider. Pharyngitis Pharyngitis is redness, pain, and swelling (inflammation) of your pharynx.  CAUSES  Pharyngitis is usually caused by infection. Most of the time, these infections are from viruses (viral) and are part of a cold. However, sometimes pharyngitis is caused by bacteria (bacterial). Pharyngitis can also be caused by allergies. Viral pharyngitis may be spread from person to person by coughing, sneezing, and personal items or utensils (cups, forks, spoons, toothbrushes). Bacterial pharyngitis may be spread from person to person by more intimate contact, such as kissing.  SIGNS AND SYMPTOMS  Symptoms of pharyngitis include:   Sore throat.   Tiredness (fatigue).   Low-grade fever.   Headache.  Joint pain and muscle aches.  Skin rashes.  Swollen lymph nodes.  Plaque-like film on throat or tonsils (often seen with bacterial pharyngitis). DIAGNOSIS  Your health care provider will ask you questions about your illness and your symptoms. Your medical history, along with a physical exam, is often all that is needed to diagnose pharyngitis. Sometimes, a rapid strep test is done. Other lab tests may also be done, depending on the suspected cause.  TREATMENT  Viral pharyngitis will usually get better in 3-4 days without the use of medicine. Bacterial pharyngitis is treated with medicines that kill germs (antibiotics).  HOME CARE INSTRUCTIONS   Drink enough water and fluids to keep your urine clear or pale yellow.   Only take over-the-counter or prescription medicines as directed by your health care provider:   If you are prescribed antibiotics, make sure you finish them even if you start to feel  better.   Do not take aspirin.   Get lots of rest.   Gargle with 8 oz of salt water ( tsp of salt per 1 qt of water) as often as every 1-2 hours to soothe your throat.   Throat lozenges (if you are not at risk for choking) or sprays may be used to soothe your throat. SEEK MEDICAL CARE IF:   You have large, tender lumps in your neck.  You have a rash.  You cough up green, yellow-brown, or bloody spit. SEEK IMMEDIATE MEDICAL CARE IF:   Your neck becomes stiff.  You drool or are unable to swallow liquids.  You vomit or are unable to keep medicines or liquids down.  You have severe pain that does not go away with the use of recommended medicines.  You have trouble breathing (not caused by a stuffy nose). MAKE SURE YOU:   Understand these instructions.  Will watch your condition.  Will get help right away if you are not doing well or get worse. Document Released: 05/12/2005 Document Revised: 03/02/2013 Document Reviewed: 01/17/2013 [facility] Patient Information 2015 [place], [place]. This information is not intended to replace advice given to you by your health care provider. Make sure you discuss any questions you have with your health care provider. Otitis Externa Otitis externa is a bacterial or fungal infection of the outer ear canal. This is the area from  the eardrum to the outside of the ear. Otitis externa is sometimes called "swimmer's ear." CAUSES  Possible causes of infection include:  Swimming in dirty water.  Moisture remaining in the ear after swimming or bathing.  Mild injury (trauma) to the ear.  Objects stuck in the ear (foreign body).  Cuts or scrapes (abrasions) on the outside of the ear. SIGNS AND SYMPTOMS  The first symptom of infection is often itching in the ear canal. Later signs and symptoms may include swelling and redness of the ear canal, ear pain, and yellowish-white fluid (pus) coming from the ear. The ear pain may be worse when pulling on  the earlobe. DIAGNOSIS  Your health care provider will perform a physical exam. A sample of fluid may be taken from the ear and examined for bacteria or fungi. TREATMENT  Antibiotic ear drops are often given for 10 to 14 days. Treatment may also include pain medicine or corticosteroids to reduce itching and swelling. HOME CARE INSTRUCTIONS   Apply antibiotic ear drops to the ear canal as prescribed by your health care provider.  Take medicines only as directed by your health care provider.  If you have diabetes, follow any additional treatment instructions from your health care provider.  Keep all follow-up visits as directed by your health care provider. PREVENTION   Keep your ear dry. Use the corner of a towel to absorb water out of the ear canal after swimming or bathing.  Avoid scratching or putting objects inside your ear. This can damage the ear canal or remove the protective wax that lines the canal. This makes it easier for bacteria and fungi to grow.  Avoid swimming in lakes, polluted water, or poorly chlorinated pools.  You may use ear drops made of rubbing alcohol and vinegar after swimming. Combine equal parts of white vinegar and alcohol in a bottle. Put 3 or 4 drops into each ear after swimming. SEEK MEDICAL CARE IF:   You have a fever.  Your ear is still red, swollen, painful, or draining pus after 3 days.  Your redness, swelling, or pain gets worse.  You have a severe headache.  You have redness, swelling, pain, or tenderness in the area behind your ear. MAKE SURE YOU:   Understand these instructions.  Will watch your condition.  Will get help right away if you are not doing well or get worse. Document Released: 05/12/2005 Document Revised: 09/26/2013 Document Reviewed: 05/29/2011 [facility] Patient Information 2015 [place], [place]. This information is not intended to replace advice given to you by your health care provider. Make sure you discuss any questions  you have with your health care provider.

## 2014-12-13 NOTE — ED Notes (Signed)
Per EMS - pt has had sore throat and cough x5 days, patient believes to be running fever however has not measured it. Pt states it hurts to move her neck. Tearful on arrival. States "I feel clogged in my throat, its hard to breath." A/Ox4. VSS

## 2014-12-13 NOTE — ED Provider Notes (Signed)
Patient seen/examined in the Emergency Department in conjunction with Midlevel Provider  Patient reports sore throat Exam : awake/alert, no drooling, no dental abscess, no trismus, neck is supple, she does have cervical LAD Plan: recent CT neck negative Advised to add on ABX Defer steroids due to h/o diabetes Pt is clinically stable for d/c home    Zadie Rhineonald Kahlan Engebretson, MD 12/13/14 1155

## 2014-12-13 NOTE — ED Notes (Signed)
Pt reports throat pain is returning. States "my throat feels like it's shutting again." Notified PA Will.

## 2014-12-13 NOTE — ED Notes (Addendum)
Pt states "it feels like my throat is closing shut." pt tonsils are swollen, however airway remains intact. Pt sats at 100% at current time. Pt is having difficulty speaking. PA notified.

## 2014-12-13 NOTE — ED Notes (Addendum)
Pt CBG 294. Pt states she has not taken her insulin today. Also states blood glucose normally runs in the 400's.

## 2014-12-13 NOTE — ED Provider Notes (Signed)
CSN: [id number]     Arrival date & time 12/13/14  1025 History   First MD Initiated Contact with Patient 12/13/14 1025     Chief Complaint  Patient presents with  . Sore Throat  . Neck Pain  . Shortness of Breath   Sible Balcerzak is a 24 y.o. female with history of obesity, hypertension, and diabetes who presents to the emergency department complaining of continued sore throat for the past 5 days. The patient reports she was seen 3 days ago in the emergency department and reports that she is just not feeling any better. She reports it feels like her throat is swollen and she complains of left sided cervical pain. She reports a fever of 101 this morning. She reports she last took Hycet around 4 am this morning. She complains of 10/10 pain currently. Reports she has been drinking water and has eaten some mashed potatoes but that it hurts to swallow. Patient also complains of right ear pain with some drainage of the past few days. She reports a slight cough due to stuff draining down the back of her throat. She reports vomiting twice this morning. She reports this was mucous and spit. She denies any abdominal pain or hematemesis. The patient denies neck stiffness, headache, eye pain, vision changes, abdominal pain, urinary symptoms, current nausea, or rahes. She denies any ACE inhibitor use. She has not been taking chloraseptic. She reports having   (Consider location/radiation/quality/duration/timing/severity/associated sxs/prior Treatment) HPI  Past Medical History  Diagnosis Date  . Hypertension   . Obesity   . Thyroid disease   . Hypothyroidism   . Asthma   . Diabetes mellitus     Takes metformin and insulin   Past Surgical History  Procedure Laterality Date  . Wisdom tooth extraction    . Cesarean section  10/25/2011    Procedure: CESAREAN SECTION;  Surgeon: Lesly Dukes, MD;  Location: [facility];  Service: Gynecology;  Laterality: N/A;  Primary cesarean section of baby boy at  67 APGAR   Family History  Problem Relation Age of Onset  . Cancer Paternal Aunt   . Cancer Paternal Uncle   . Diabetes Paternal Grandmother   . Diabetes Paternal Grandfather   . Anesthesia problems Neg Hx    History  Substance Use Topics  . Smoking status: Never Smoker   . Smokeless tobacco: Never Used  . Alcohol Use: No   OB History    Gravida Para Term Preterm AB TAB SAB Ectopic Multiple Living       Review of Systems  Constitutional: Positive for fever and chills.  HENT: Positive for ear discharge, ear pain, postnasal drip, rhinorrhea and sore throat. Negative for congestion, facial swelling, hearing loss, mouth sores, sinus pressure and voice change.   Eyes: Negative for pain and visual disturbance.  Respiratory: Positive for cough. Negative for shortness of breath and wheezing.   Cardiovascular: Negative for chest pain and palpitations.  Gastrointestinal: Positive for vomiting. Negative for nausea, abdominal pain and diarrhea.  Genitourinary: Negative for dysuria and difficulty urinating.  Musculoskeletal: Negative for back pain, neck pain and neck stiffness.  Skin: Negative for rash.  Neurological: Negative for speech difficulty, light-headedness and headaches.      Allergies  Review of patient's allergies indicates no known allergies.  Home Medications   Prior to Admission medications   Medication Sig Start Date End Date Taking? Authorizing Provider  albuterol (PROVENTIL HFA;VENTOLIN HFA) 108 (  90 BASE) MCG/ACT inhaler Inhale 2 puffs into the lungs every 6 (six) hours as needed for wheezing or shortness of breath.   Yes Historical Provider, MD  ibuprofen (ADVIL,MOTRIN) 800 MG tablet Take 800 mg by mouth every 8 (eight) hours as needed for moderate pain.   Yes Historical Provider, MD  Insulin Detemir (LEVEMIR FLEXPEN) 100 UNIT/ML Pen Inject 60 Units into the skin every morning.   Yes Historical Provider, MD  metFORMIN (GLUCOPHAGE) 1000 MG  tablet Take 1 tablet (1,000 mg total) by mouth 2 (two) times daily. 07/27/13  Yes Mellody Drown, PA-C  phenol (CHLORASEPTIC) 1.4 % LIQD Use as directed 1 spray in the mouth or throat as needed for throat irritation / pain.   Yes Historical Provider, MD  Phenylephrine-DM (THERAFLU COLD/COUGH DAYTIME PO) Take 2 capsules by mouth daily as needed (for cold).   Yes Historical Provider, MD  amoxicillin-clavulanate (AUGMENTIN) 875-125 MG per tablet Take 1 tablet by mouth every 12 (twelve) hours. 12/13/14   Everlene Farrier, PA-C  ciprofloxacin-dexamethasone (CIPRODEX) otic suspension Place 4 drops into both ears 2 (two) times daily. 12/13/14   Everlene Farrier, PA-C  HYDROcodone-acetaminophen (HYCET) 7.5-325 mg/15 ml solution Take 15 mLs by mouth every 8 (eight) hours as needed for moderate pain or severe pain. 12/13/14   Everlene Farrier, PA-C  ondansetron (ZOFRAN ODT) 4 MG disintegrating tablet Take 1 tablet (4 mg total) by mouth every 8 (eight) hours as needed for nausea or vomiting. 12/13/14   Everlene Farrier, PA-C   BP 123/81 mmHg  Pulse 87  Temp(Src) 98.4 F (36.9 C) (Oral)  Resp 16  SpO2 100%  LMP 11/29/2014 Physical Exam  Constitutional: She is oriented to person, place, and time. She appears well-developed and well-nourished. No distress.  Nontoxic-appearing. Patient appears to be in pain and is slightly tearful.  HENT:  Head: Normocephalic and atraumatic.  Mouth/Throat: No oropharyngeal exudate.  Mild edema and erythema of the EAC of her left and right ear with otorrhea. No mastoid tenderness.  There is mild tonsillar hypertrophy without exudates. Uvula is midline without edema. Airway is patent. Patient is speaking in clear full sentences. No evidence of peritonsillar abscess. No drooling. Normal tongue protrusion. No dental abscess. No trismus.   Eyes: Conjunctivae are normal. Pupils are equal, round, and reactive to light. Right eye exhibits no discharge. Left eye exhibits no discharge.  Neck:  Normal range of motion. Neck supple. No JVD present. No tracheal deviation present.  There is tender cervical lymphadenopathy bilaterally which is worse on her left side. Patient has full range of motion of her neck. The patient is able to place her chin to her chest and look up to the ceiling. She is able to turn her head in greater than 45 in each direction. Neck is supple.   Cardiovascular: Normal rate, regular rhythm, normal heart sounds and intact distal pulses.  Exam reveals no gallop and no friction rub.   No murmur heard. Pulmonary/Chest: Effort normal and breath sounds normal. No respiratory distress. She has no wheezes. She has no rales.  Lungs are clear to auscultation bilaterally.  Abdominal: Soft. She exhibits no distension. There is no tenderness. There is no guarding.  Musculoskeletal: She exhibits no edema.  Lymphadenopathy:    She has cervical adenopathy.  Neurological: She is alert and oriented to person, place, and time. No cranial nerve deficit. Coordination normal.  Skin: Skin is warm and dry. No rash noted. She is not diaphoretic. No erythema. No pallor.  Psychiatric: She  has a normal mood and affect. Her behavior is normal.  Nursing note and vitals reviewed.   ED Course  Procedures (including critical care time) Labs Review Labs Reviewed  CBG MONITORING, ED - Abnormal; Notable for the following:    Glucose-Capillary 294 (*)    All other components within normal limits  CBG MONITORING, ED - Abnormal; Notable for the following:    Glucose-Capillary 294 (*)    All other components within normal limits    Imaging Review No results found.   EKG Interpretation None      Filed Vitals:   12/13/14 1315 12/13/14 1330 12/13/14 1339 12/13/14 1427  BP: 127/73 130/71 130/71 123/81  Pulse:   83 87  Temp:      TempSrc:      Resp:  13 20 16   SpO2:   100% 100%     MDM   Meds given in ED:  Medications  HYDROcodone-acetaminophen (HYCET) 7.5-325 mg/15 ml solution  10 mL (10 mLs Oral Given 12/13/14 1108)  ondansetron (ZOFRAN-ODT) disintegrating tablet 4 mg (4 mg Oral Given 12/13/14 1108)  amoxicillin-clavulanate (AUGMENTIN) 875-125 MG per tablet 1 tablet (1 tablet Oral Given 12/13/14 1340)  HYDROcodone-acetaminophen (HYCET) 7.5-325 mg/15 ml solution 10 mL (10 mLs Oral Given 12/13/14 1422)    New Prescriptions   AMOXICILLIN-CLAVULANATE (AUGMENTIN) 875-125 MG PER TABLET    Take 1 tablet by mouth every 12 (twelve) hours.   CIPROFLOXACIN-DEXAMETHASONE (CIPRODEX) OTIC SUSPENSION    Place 4 drops into both ears 2 (two) times daily.   ONDANSETRON (ZOFRAN ODT) 4 MG DISINTEGRATING TABLET    Take 1 tablet (4 mg total) by mouth every 8 (eight) hours as needed for nausea or vomiting.     Final diagnoses:  Lymphadenitis  Sore throat  Otitis externa, bilateral   This is a 24 y.o. female with history of obesity, hypertension, and diabetes who presents to the emergency department complaining of continued sore throat for the past 5 days. The patient reports she was seen 3 days ago in the emergency department and reports that she is just not feeling any better. She reports it feels like her throat is swollen and she complains of left sided cervical pain. She reports a fever of 101 this morning. She reports she last took Hycet around 4 am this morning. She complains of 10/10 pain currently. Reports she has been drinking water and has eaten some mashed potatoes but that it hurts to swallow. Patient also complains of right ear pain with some drainage of the past few days. The patient was seen in the emergency department on 12/10/2014 and had a CT scan of her soft tissue of her neck which showed bilateral cervical adenopathy and no abscesses. On exam the patient is afebrile and nontoxic appearing. She is speaking in clear full sentences. Her lungs are clear to auscultation. No drooling noted. The patient does have mild bilateral tonsillar hypertrophy without exudates. Her uvula is  midline without edema. There is no posterior oropharyngeal edema. Is no evidence of peritonsillar abscess. Patient does have bilateral cervical lymphadenopathy with her left more tender than her right. Patient also has evidence of otitis externa bilaterally.  Patient is a diabetic and reports she hasn't sought at home but has not taken her insulin today. Her blood sugar is 294 in the emergency department. She reports that her blood sugars usually run in the 400s. I encouraged her to continue to check her blood sugar and to use insulin as needed. Patient is provided  with Hycet, Zofran and her first dose of Augmentin in the ED. The patient tolerated several glasses of juice, applesauce and part of a sandwich in the ED without vomiting. She reports the hycet helps her pain somewhat. As patient has history of diabetes and elevated blood sugar will not provide the patient with steroid at this time. We'll discharge the patient a prescription for Augmentin, Zofran, Ciprodex eardrops and pain medicine. I encouraged her to follow-up with ENT doctor [facility]. I see no need for repeat CT scan today as her one 3 days ago was negative and the patient is eating and drinking in the ED. Strict return precautions provided. I advised the patient to follow-up with ENT this week. I advised the patient to return to the emergency department with new or worsening symptoms or new concerns. The patient verbalized understanding and agreement with plan.    This patient was discussed with and evaluated by Dr. Bebe Shaggy who agrees with assessment and plan.   Everlene Farrier, PA-C 12/13/14 1437  Zadie Rhine, MD 12/13/14 1452

## 2014-12-13 NOTE — ED Notes (Signed)
Pt verbalizes understanding of discharge instructions. NAD on departure. A/O x4. VSS.

## 2015-02-20 ENCOUNTER — Ambulatory Visit: Payer: Medicaid Other | Attending: Internal Medicine

## 2015-02-20 DIAGNOSIS — Z0000 Encounter for general adult medical examination without abnormal findings: Secondary | ICD-10-CM

## 2015-02-22 NOTE — Progress Notes (Signed)
This encounter was created in error - please disregard.

## 2015-05-17 ENCOUNTER — Emergency Department (HOSPITAL_COMMUNITY)
Admission: EM | Admit: 2015-05-17 | Discharge: 2015-05-27 | Disposition: E | Payer: Medicaid Other | Attending: Emergency Medicine | Admitting: Emergency Medicine

## 2015-05-17 ENCOUNTER — Encounter (HOSPITAL_COMMUNITY): Payer: Self-pay | Admitting: Emergency Medicine

## 2015-05-17 DIAGNOSIS — Y92410 Unspecified street and highway as the place of occurrence of the external cause: Secondary | ICD-10-CM | POA: Diagnosis not present

## 2015-05-17 DIAGNOSIS — R140 Abdominal distension (gaseous): Secondary | ICD-10-CM | POA: Insufficient documentation

## 2015-05-17 DIAGNOSIS — Y9389 Activity, other specified: Secondary | ICD-10-CM | POA: Diagnosis not present

## 2015-05-17 DIAGNOSIS — J9600 Acute respiratory failure, unspecified whether with hypoxia or hypercapnia: Secondary | ICD-10-CM | POA: Diagnosis not present

## 2015-05-17 DIAGNOSIS — S0990XA Unspecified injury of head, initial encounter: Secondary | ICD-10-CM | POA: Diagnosis present

## 2015-05-17 DIAGNOSIS — Y999 Unspecified external cause status: Secondary | ICD-10-CM | POA: Diagnosis not present

## 2015-05-17 DIAGNOSIS — S0083XA Contusion of other part of head, initial encounter: Secondary | ICD-10-CM | POA: Insufficient documentation

## 2015-05-17 DIAGNOSIS — S0081XA Abrasion of other part of head, initial encounter: Secondary | ICD-10-CM | POA: Insufficient documentation

## 2015-05-17 DIAGNOSIS — I469 Cardiac arrest, cause unspecified: Secondary | ICD-10-CM | POA: Insufficient documentation

## 2015-05-17 LAB — PREPARE FRESH FROZEN PLASMA
UNIT DIVISION: 0
Unit division: 0

## 2015-05-17 MED ORDER — SODIUM CHLORIDE 0.9 % IV SOLN
INTRAVENOUS | Status: AC | PRN
  Administered 2015-05-17: 1000 mL via INTRAVENOUS
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
## 2015-05-17 NOTE — ED Notes (Signed)
ME called @ [phone]
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
## 2015-05-18 ENCOUNTER — Encounter (HOSPITAL_COMMUNITY): Payer: Self-pay | Admitting: Emergency Medicine

## 2015-05-18 DIAGNOSIS — S0990XA Unspecified injury of head, initial encounter: Secondary | ICD-10-CM | POA: Diagnosis present

## 2015-05-18 DIAGNOSIS — S0083XA Contusion of other part of head, initial encounter: Secondary | ICD-10-CM | POA: Diagnosis not present

## 2015-05-18 DIAGNOSIS — J9600 Acute respiratory failure, unspecified whether with hypoxia or hypercapnia: Secondary | ICD-10-CM | POA: Diagnosis not present

## 2015-05-18 DIAGNOSIS — I469 Cardiac arrest, cause unspecified: Secondary | ICD-10-CM | POA: Diagnosis not present

## 2015-05-18 DIAGNOSIS — Y92410 Unspecified street and highway as the place of occurrence of the external cause: Secondary | ICD-10-CM | POA: Diagnosis not present

## 2015-05-18 DIAGNOSIS — R140 Abdominal distension (gaseous): Secondary | ICD-10-CM | POA: Diagnosis not present

## 2015-05-18 DIAGNOSIS — S0081XA Abrasion of other part of head, initial encounter: Secondary | ICD-10-CM | POA: Diagnosis not present

## 2015-05-18 DIAGNOSIS — Y999 Unspecified external cause status: Secondary | ICD-10-CM | POA: Diagnosis not present

## 2015-05-18 DIAGNOSIS — Y9389 Activity, other specified: Secondary | ICD-10-CM | POA: Diagnosis not present

## 2015-05-18 MED FILL — Medication: Qty: 1 | Status: AC

## 2015-05-18 NOTE — Progress Notes (Signed)
RT Note: RT assisted MD with intubation. Pt manually bagged until code called at 2309.
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
## 2015-05-18 NOTE — ED Notes (Signed)
I attempted to call this patient's emergency contact, Moody BruinsLinda Enoch and no-one answered the phone.  I also attempted to call a sister, Rolin BarryKadajah Meikle [phone] and that is not a working number.

## 2015-05-27 DEATH — deceased

## 2017-04-22 IMAGING — DX DG CHEST 2V
2 series · 2 of 2 positions shown · non-contrast
Comparison: 08/12/2013

CLINICAL DATA: Cough, body aches for 3 days

EXAM:
CHEST  2 VIEW

[chest pa]
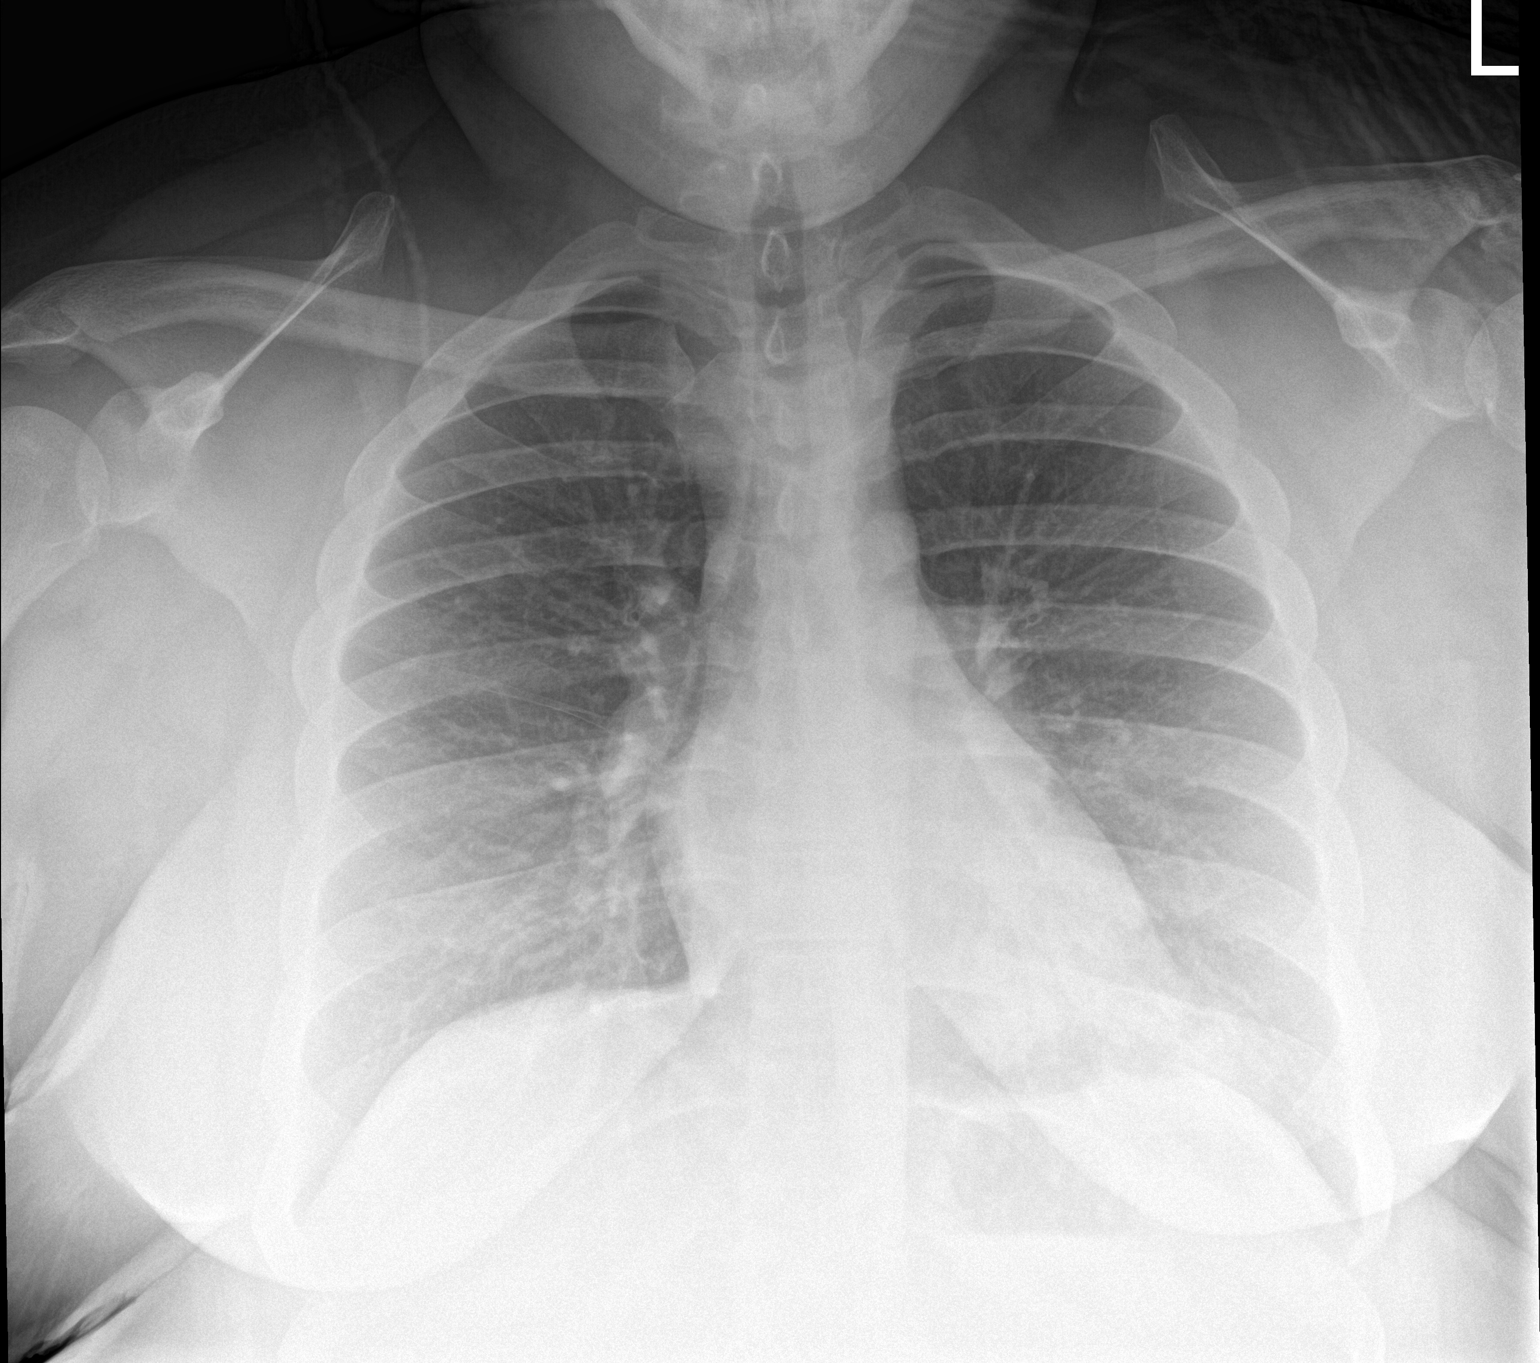

[chest lat]
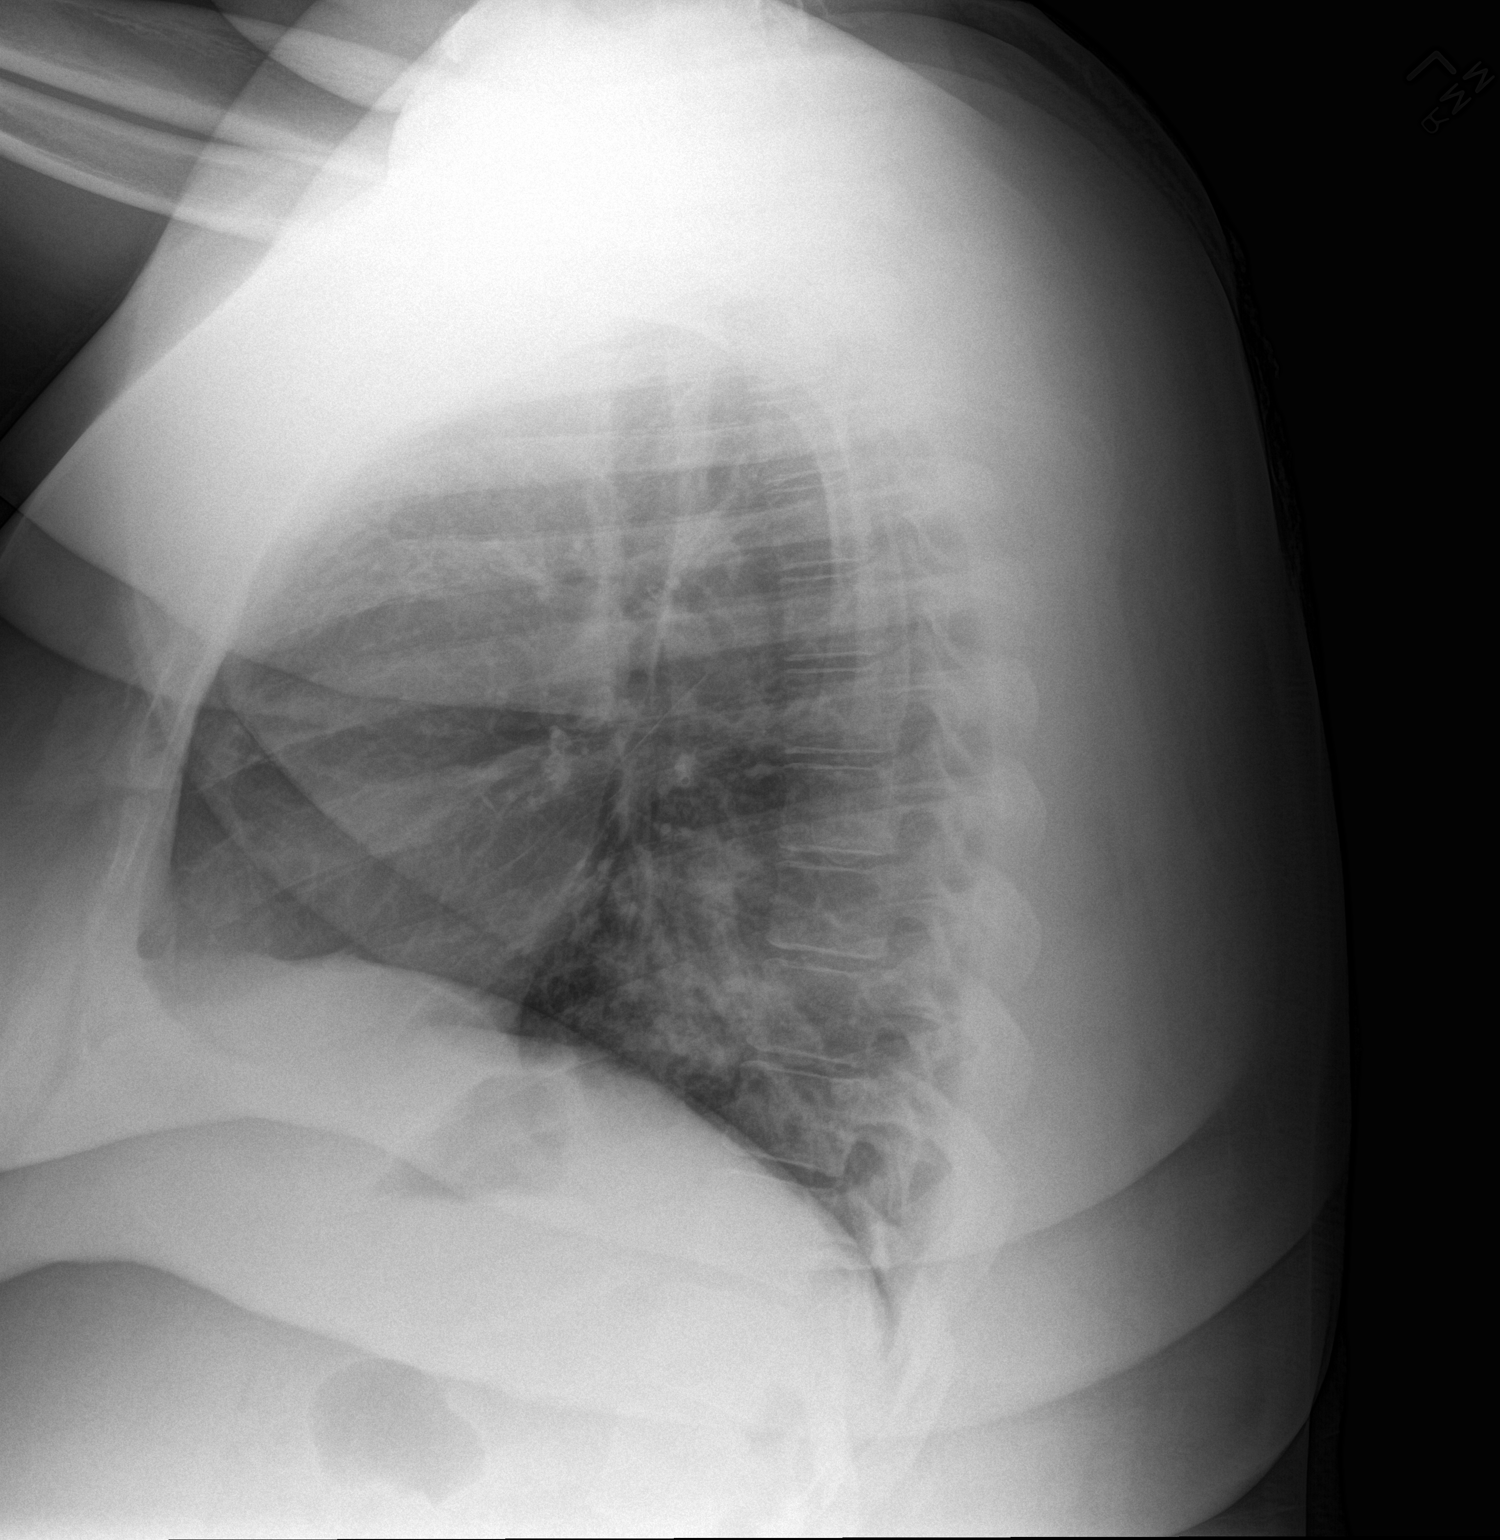

[2 of 2 positions shown; findings below may reference images not displayed]

FINDINGS: Cardiomediastinal silhouette is stable. There is streaky left base
retrocardiac atelectasis or infiltrate best seen on lateral view. No
pulmonary edema. Bony thorax is stable.
IMPRESSION: Streaky left base retrocardiac atelectasis or infiltrate best seen
on lateral view. No pulmonary edema.

## 2017-06-16 IMAGING — CR DG CHEST 2V
2 series · 2 of 2 positions shown · non-contrast
Comparison: Chest radiograph from 10/16/2014

CLINICAL DATA: Acute onset of cough, shortness of breath and fever.
Initial encounter.

EXAM:
CHEST  2 VIEW

[chest pa]
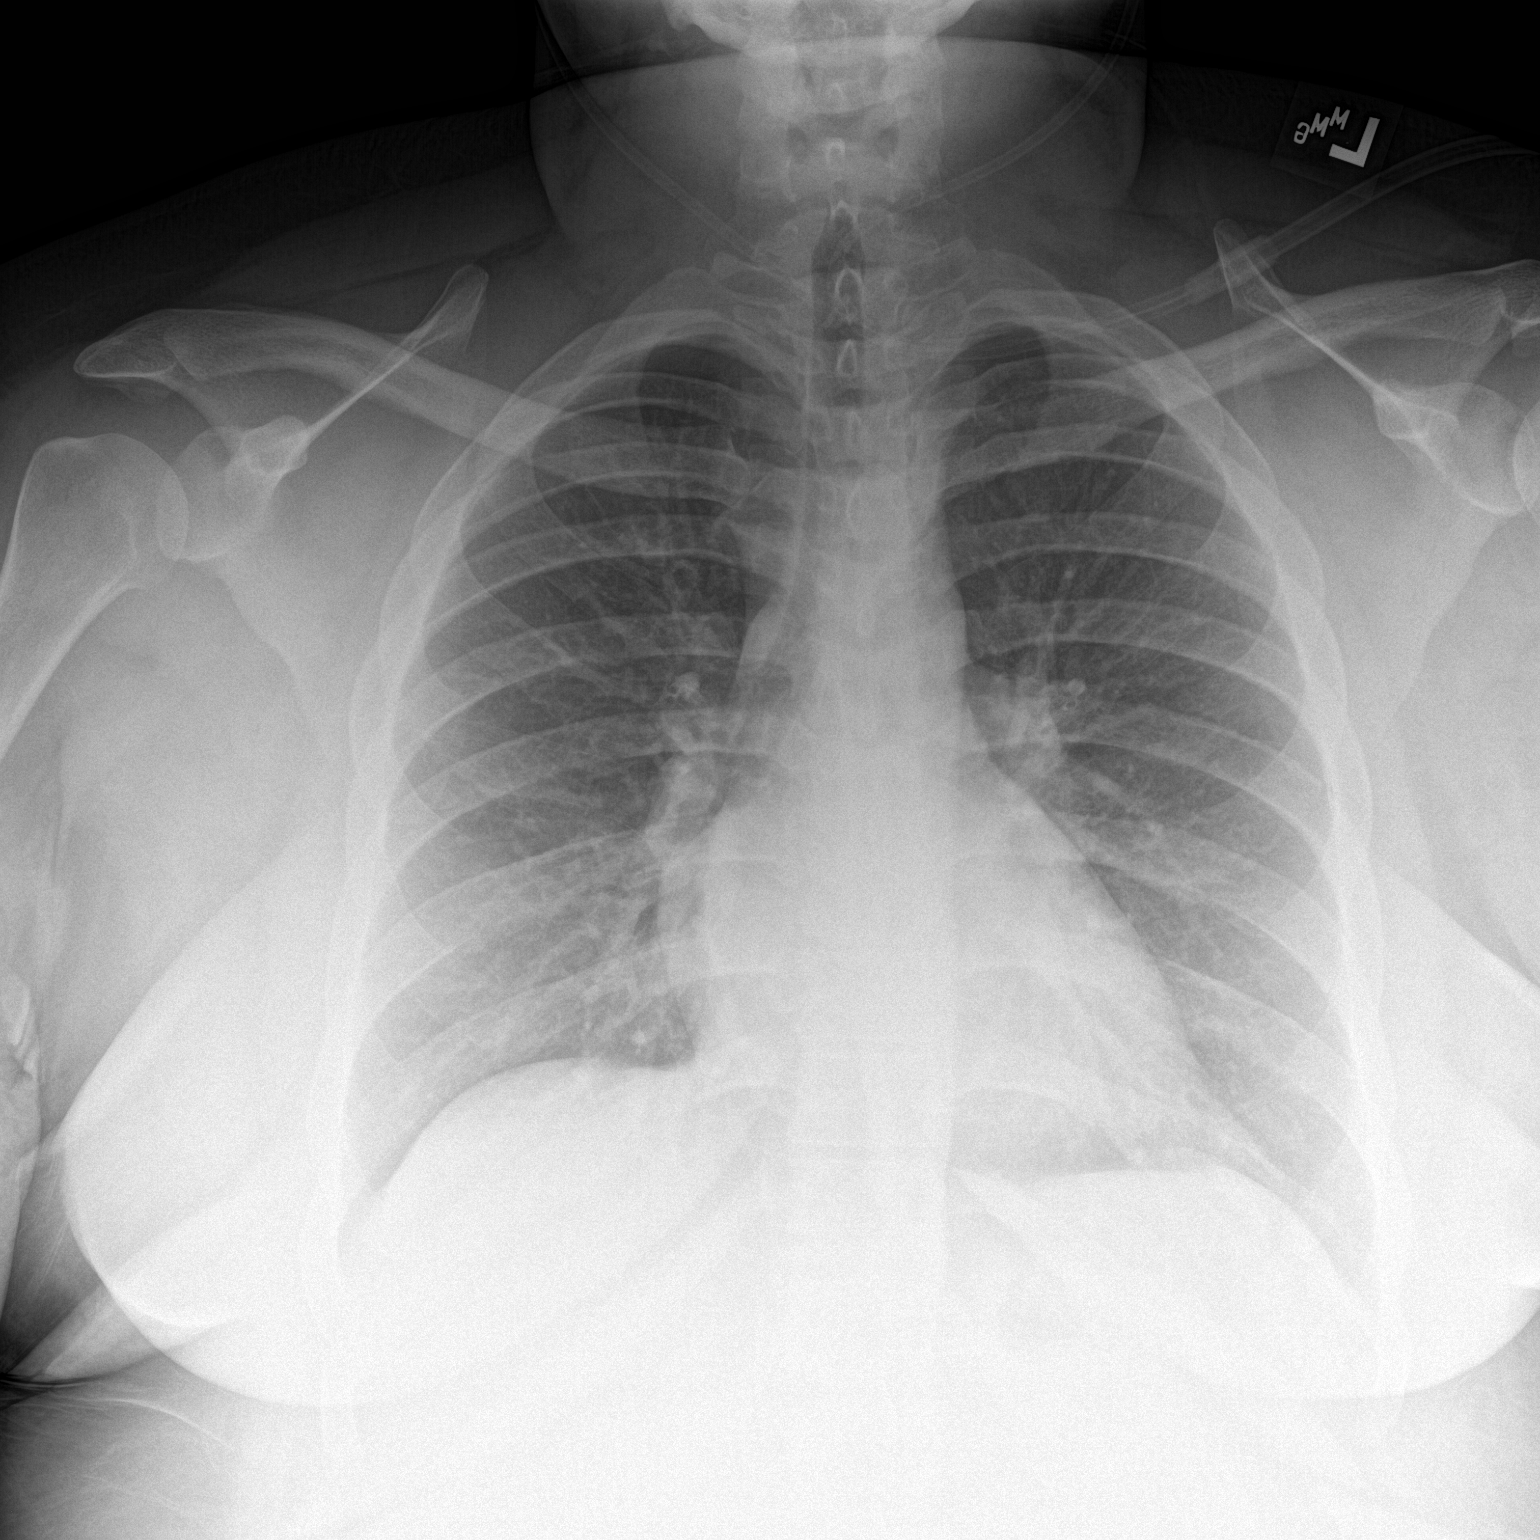

[chest lat]
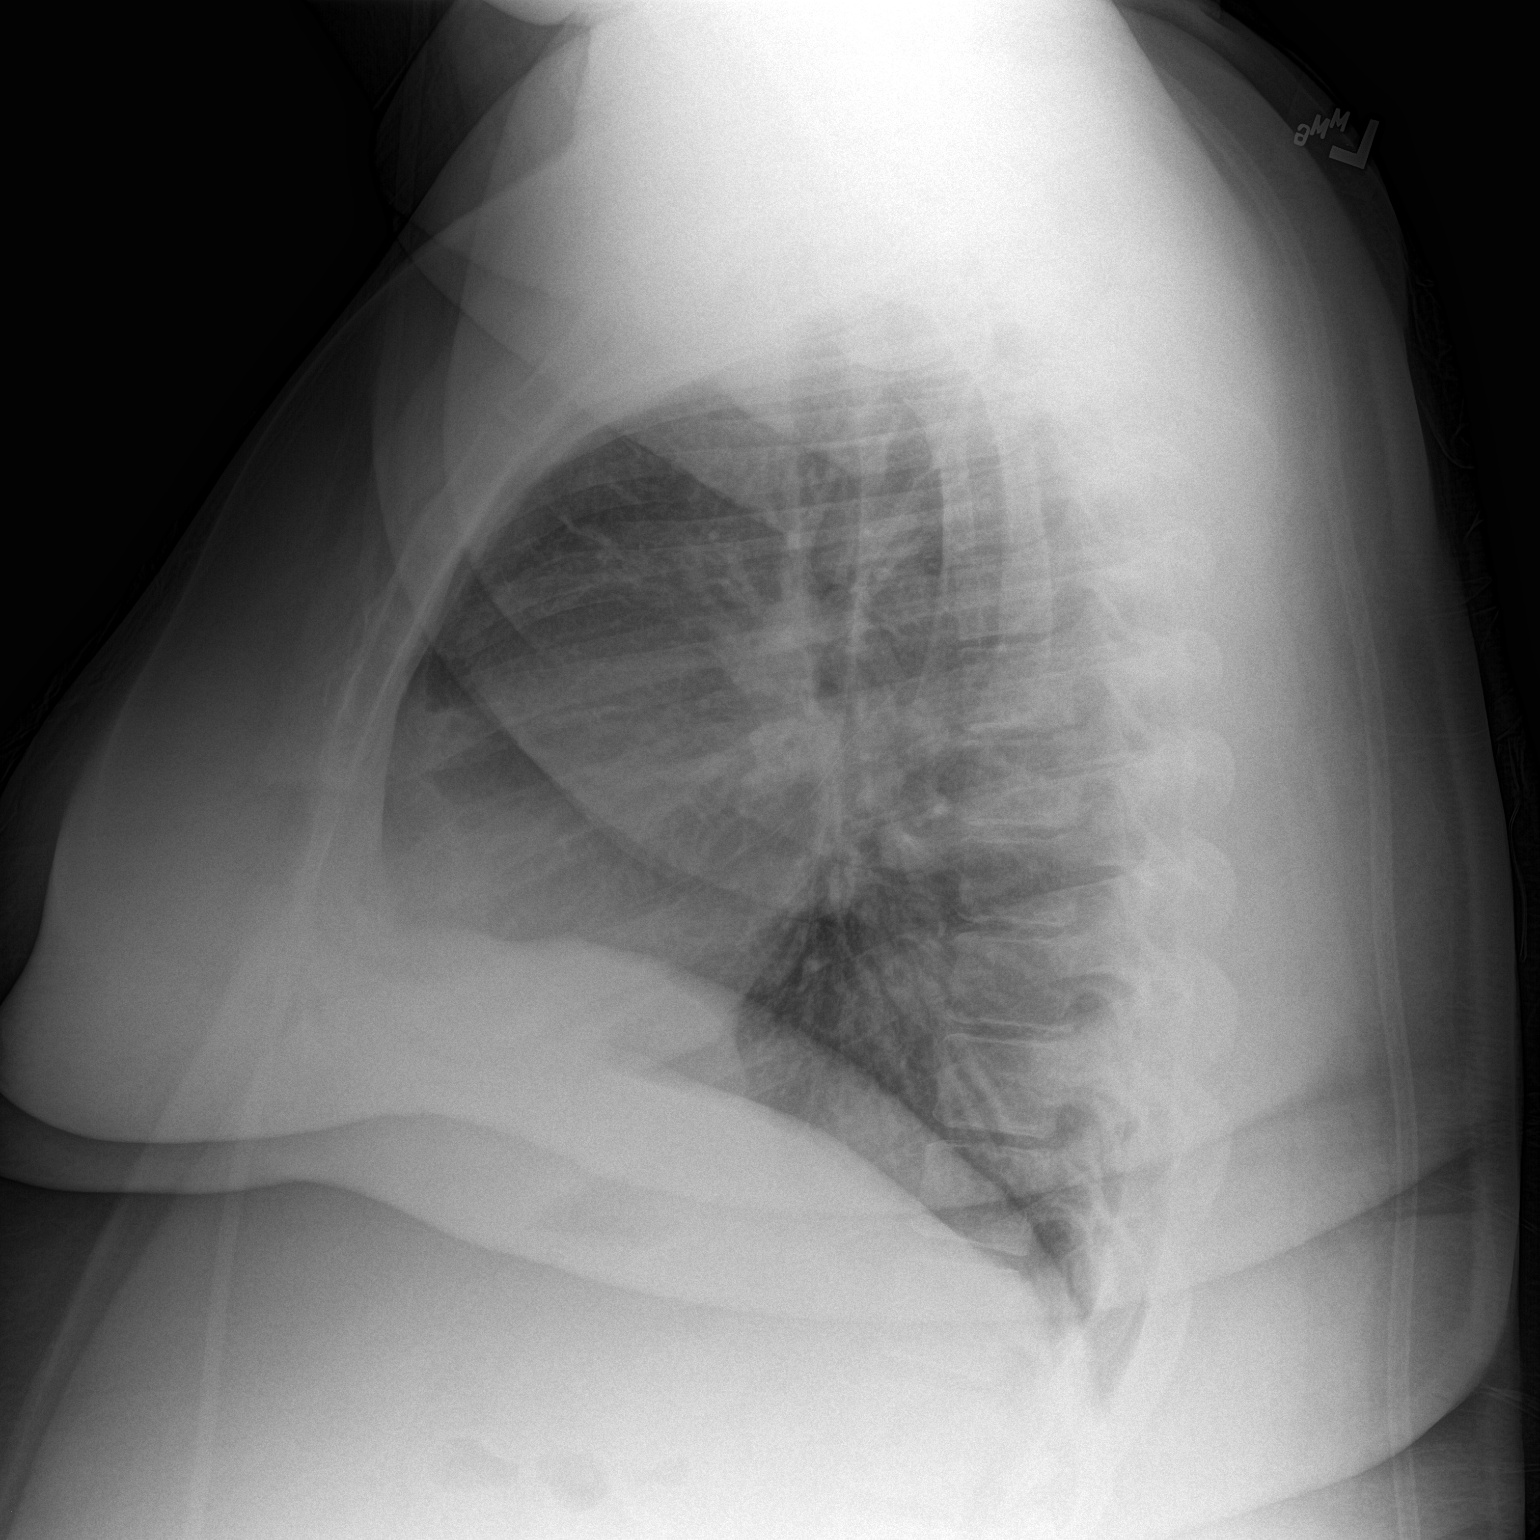

[2 of 2 positions shown; findings below may reference images not displayed]

FINDINGS: The lungs are well-aerated and clear. There is no evidence of focal
opacification, pleural effusion or pneumothorax.

The heart is normal in size; the mediastinal contour is within
normal limits. No acute osseous abnormalities are seen.
IMPRESSION: No acute cardiopulmonary process seen.
